# Patient Record
Sex: Female | Born: 1965 | Hispanic: Yes | Marital: Single | Smoking: Never smoker
Health system: Southern US, Community
[De-identification: ages and names within clinical notes are randomized; demographics above are authoritative.]

## PROBLEM LIST (undated history)

## (undated) DIAGNOSIS — I10 Essential (primary) hypertension: Secondary | ICD-10-CM

## (undated) DIAGNOSIS — R87619 Unspecified abnormal cytological findings in specimens from cervix uteri: Secondary | ICD-10-CM

## (undated) DIAGNOSIS — G5600 Carpal tunnel syndrome, unspecified upper limb: Secondary | ICD-10-CM

## (undated) DIAGNOSIS — IMO0002 Reserved for concepts with insufficient information to code with codable children: Secondary | ICD-10-CM

## (undated) DIAGNOSIS — E7800 Pure hypercholesterolemia, unspecified: Secondary | ICD-10-CM

## (undated) HISTORY — DX: Essential (primary) hypertension: I10

## (undated) HISTORY — DX: Pure hypercholesterolemia, unspecified: E78.00

## (undated) HISTORY — DX: Reserved for concepts with insufficient information to code with codable children: IMO0002

## (undated) HISTORY — DX: Carpal tunnel syndrome, unspecified upper limb: G56.00

## (undated) HISTORY — DX: Unspecified abnormal cytological findings in specimens from cervix uteri: R87.619

## (undated) HISTORY — PX: LEEP: SHX91

---

## 2006-10-10 ENCOUNTER — Ambulatory Visit: Payer: Self-pay | Admitting: Gynecology

## 2006-10-31 ENCOUNTER — Ambulatory Visit: Payer: Self-pay | Admitting: Obstetrics and Gynecology

## 2006-11-25 ENCOUNTER — Ambulatory Visit: Payer: Self-pay | Admitting: Obstetrics and Gynecology

## 2006-11-25 ENCOUNTER — Ambulatory Visit (HOSPITAL_COMMUNITY): Admission: RE | Admit: 2006-11-25 | Discharge: 2006-11-25 | Payer: Self-pay | Admitting: Obstetrics and Gynecology

## 2006-11-25 ENCOUNTER — Encounter: Payer: Self-pay | Admitting: Obstetrics and Gynecology

## 2006-12-19 ENCOUNTER — Ambulatory Visit: Payer: Self-pay | Admitting: Family Medicine

## 2007-05-28 ENCOUNTER — Encounter (INDEPENDENT_AMBULATORY_CARE_PROVIDER_SITE_OTHER): Payer: Self-pay | Admitting: Gynecology

## 2007-05-28 ENCOUNTER — Ambulatory Visit: Payer: Self-pay | Admitting: Gynecology

## 2008-01-16 ENCOUNTER — Encounter: Payer: Self-pay | Admitting: Obstetrics & Gynecology

## 2008-01-16 ENCOUNTER — Ambulatory Visit: Payer: Self-pay | Admitting: Obstetrics & Gynecology

## 2008-01-28 ENCOUNTER — Ambulatory Visit (HOSPITAL_COMMUNITY): Admission: RE | Admit: 2008-01-28 | Discharge: 2008-01-28 | Payer: Self-pay | Admitting: Obstetrics and Gynecology

## 2008-11-26 ENCOUNTER — Encounter: Payer: Self-pay | Admitting: Obstetrics & Gynecology

## 2008-11-26 ENCOUNTER — Ambulatory Visit: Payer: Self-pay | Admitting: Obstetrics & Gynecology

## 2009-03-16 ENCOUNTER — Ambulatory Visit (HOSPITAL_COMMUNITY): Admission: RE | Admit: 2009-03-16 | Discharge: 2009-03-16 | Payer: Self-pay | Admitting: Obstetrics & Gynecology

## 2010-01-20 ENCOUNTER — Emergency Department (HOSPITAL_COMMUNITY): Admission: EM | Admit: 2010-01-20 | Discharge: 2010-01-20 | Payer: Self-pay | Admitting: Emergency Medicine

## 2010-03-17 ENCOUNTER — Ambulatory Visit (HOSPITAL_COMMUNITY): Admission: RE | Admit: 2010-03-17 | Discharge: 2010-03-17 | Payer: Self-pay | Admitting: Obstetrics and Gynecology

## 2010-09-26 NOTE — Group Therapy Note (Signed)
NAME:  Brenda [name], REMPEL NO.:  [id number]   MEDICAL RECORD NO.:  [id number]          PATIENT TYPE:  WOC   LOCATION:  [facility]                   FACILITY:  [facility]   PHYSICIAN:  Elsie Lincoln, MD      DATE OF BIRTH:  [birth year]   DATE OF SERVICE:  01/16/2008                                  CLINIC NOTE   The patient is a 45 year old para 3 female who presents for repeat Pap  smear.  The patient underwent a cold knife cone for a positive ECC in  July 2008 that showed had high-grade squamous intraepithelial lesion  CIN2/CIN3, dysplasia cervically extends into underlying endocervical  glands with margins free of dysplasia.  The patient had a repeat Pap  smear once in January 2009, which was negative.  The patient missed  several appointments and rescheduled for today.   PAST MEDICAL HISTORY:  Denies all problems.   PAST SURGICAL HISTORY:  Denies all surgeries other than the cold knife  cone as described above.   OBSTETRICAL HISTORY:  NSVD x3.   GYNECOLOGIC HISTORY:  Using abstinence for contraception at this time.   FAMILY HISTORY:  No ovarian cancer, breast cancer, colon cancer, or  endometrial cancer.   MEDICATIONS:  Multivitamin.   ALLERGIES:  LATEX.  No medication __________   PHYSICAL EXAMINATION:  VITAL SIGNS:  Temperature 98.5, pulse 57, blood  pressure 122/87, weight 132.9, height 5 feet 0 inches.  GENERAL:  Well-nourished, well-developed in no apparent distress.  HEENT:  Normocephalic, atraumatic.  CHEST:  Normal movement.  ABDOMEN:  Soft, nontender.  GENITALIA:  Tanner V.  Vagina pink, normal rugae.  Cervix closed,  nontender.  Uterus anteverted, nontender.  Adnexa no masses, nontender.   A 45 year old female with CIN2/3 on cold knife cone for repeat Pap  smear.  1. Pap smear today.  2. Mammogram ordered through our scholarship program.  3. Return to clinic in 6 months if Pap smear is normal.  She  will      receive a letter in the mail.  If Pap  smear is abnormal, we will      come up with a different  plan.           ______________________________  Elsie Lincoln, MD     KL/MEDQ  D:  01/16/2008  T:  01/17/2008  Job:  [id number]

## 2010-09-26 NOTE — Group Therapy Note (Signed)
NAME:  Brenda [name], AHART NO.:  [id number]   MEDICAL RECORD NO.:  [id number]          PATIENT TYPE:  WOC   LOCATION:  [facility]                   [facility]ACILITY[facility]   PHYSICIAN:  Argentina Donovan, MD        DATE OF BIRTH:  05-Sep-1965   DATE OF SERVICE:  10/31/2006                                  CLINIC NOTE   The patient is a 45 year old [place] female gravida 3, para 3-0-0-3,  youngest child is age 47, who went to the health department had a  severe cervical dysplasia, CIN II, with cervix severe endocervical  involvement.  When she came in today and examined her she has a fish-  mouth cervix with a significantly large lesion which I think poses a  problem of too much bleeding here during the outpatient.  So I am going  to admit her and do a conization.  Cold knife conization biopsy  discussed.  The procedures well as possible complications of infection  and bleeding and the patient seems to be agreeable understands.  She has  no allergies.  She is on no medications except multivitamins. She has  used the pill in the past but her husband is not around at this point  and so she is using no contraception.   IMPRESSION:  Severe cervical dysplasia with a cervical involvement.   PLAN:  Cold knife conization of the cervix.           ______________________________  Argentina Donovan, MD     PR/MEDQ  D:  10/31/2006  T:  10/31/2006  Job:  [id number]

## 2010-09-26 NOTE — Group Therapy Note (Signed)
NAME:  Brenda [name], ZUNO NO.:  [id number]   MEDICAL RECORD NO.:  [id number]          PATIENT TYPE:  WOC   [facility]ATION[facility]                   [facility]   PHYSICIAN:  Scheryl Darter, MD       DATE OF BIRTH:  Oct 14, 1965   DATE OF SERVICE:  11/26/2008                                  CLINIC NOTE   The patient returns for repeat Pap smear.  She had a cold-knife  conization performed in July 2008 which showed moderate-to-severe  cervical dysplasia.  Pap test that was done on January 16, 2008, was  negative.  Her last menstrual period was November 06, 2008, and she  currently requires no contraception.   PHYSICAL EXAMINATION:  EXTERNAL GENITALIA:  Vagina and cervix were  normal except for some scarring secondary to her conization.  Pap was  done.   IMPRESSION:  History of moderate-to-severe cervical dysplasia.   PLAN:  Today's Pap smear is normal, then she can return for yearly Pap  smears.      Scheryl Darter, MD     JA/MEDQ  D:  11/26/2008  T:  11/27/2008  Job:  [id number]

## 2010-09-26 NOTE — Op Note (Signed)
[name], [name]            ACCOUNT NO.:  [id number]   MEDICAL RECORD NO.:  [id number]          PATIENT TYPE:  AMB   LOCATION:  [facility]                           [facility]   PHYSICIAN:  Phil D. Okey Dupre, M.D.     DATE OF BIRTH:  [birth year]   DATE OF PROCEDURE:  11/25/2006  DATE OF DISCHARGE:                               OPERATIVE REPORT   PROCEDURE:  Cold knife conization of the cervix.   PREOPERATIVE DIAGNOSIS:  Severe cervical dysplasia, CIN II with  endocervical involvement.   ANESTHESIA:  Was general   SPECIMENS TO PATHOLOGY:  Cervical conization biopsy.   ESTIMATED BLOOD LOSS:  50 mL.   POSTOPERATIVE CONDITION:  Satisfactory.   SURGEON:  Dr. Okey Dupre.   DESCRIPTION OF PROCEDURE:  Under satisfactory general anesthesia with  the patient in dorsolithotomy position, perineum, vagina prepped and  draped in usual sterile manner.  Bimanual pelvic examination under  anesthesia revealed the uterus in anterior presentation easily movable  with a free cul-de-sac and firm cervix.  A weighted speculum was placed  in the posterior fourchette of the vagina through a marital introitus.  The BUS was within normal limits.  Vagina is clean and well rugated. The  cervix was fishmouthed and somewhat hypertrophied. The cervix was  treated with Lugol's solution and almost the entire posterior lip was  non-stainable.  Angle sutures as high up as possible and below enough to  not hit the bladder  figure-of-eights using one chromic catgut were  placed at 9 and 3 o'clock figure-of-eights.  These were held long for  traction and the anterior lip of the cervix was grasped with single-  tooth tenaculum. Entire circumference of the cervix was run and starting  as high as we could just by the reflection the bladder with a  superficial #1 chromic catgut suture done as a pursestring around the  entire circumference of the cervix and approximately 3 cm from the  distal end and they were tied anteriorly  and then a conization was taken  trying to stay outside of the split with the fishmouth cervix and  extending up 2 cm into the cervical canal. The cone was taken with a #15  scalpel blade and sent for pathological diagnosis..  There was some  bleeding from the posterior cuff so the cuff was run in a baseball type  suture around the entire circumference and bleeding seemed to be well  controlled at that point. The stay sutures that were put in laterally  were cut short.  No significant bleeding was noted and the patient was  transferred recovery room in satisfactory condition.      Phil D. Okey Dupre, M.D.  Electronically Signed     PDR/MEDQ  D:  11/25/2006  T:  11/25/2006  Job:  [id number]

## 2011-02-27 LAB — CBC
HCT: 38.3
Hemoglobin: 13.1
MCHC: 34.3
MCV: 90.4
RBC: 4.23
RDW: 12.8

## 2011-02-27 LAB — URINALYSIS, ROUTINE W REFLEX MICROSCOPIC
Bilirubin Urine: NEGATIVE
Glucose, UA: NEGATIVE
Ketones, ur: NEGATIVE
Leukocytes, UA: NEGATIVE
Protein, ur: NEGATIVE
pH: 6

## 2011-02-28 LAB — POCT PREGNANCY, URINE
Operator id: 149021
Preg Test, Ur: NEGATIVE

## 2011-04-09 ENCOUNTER — Other Ambulatory Visit: Payer: Self-pay | Admitting: Obstetrics and Gynecology

## 2011-04-09 DIAGNOSIS — Z1231 Encounter for screening mammogram for malignant neoplasm of breast: Secondary | ICD-10-CM

## 2011-05-10 ENCOUNTER — Ambulatory Visit (HOSPITAL_COMMUNITY)
Admission: RE | Admit: 2011-05-10 | Discharge: 2011-05-10 | Disposition: A | Discharge status: Home / Self Care | Payer: Self-pay | Source: Ambulatory Visit | Attending: Obstetrics and Gynecology | Admitting: Obstetrics and Gynecology

## 2011-05-10 DIAGNOSIS — Z1231 Encounter for screening mammogram for malignant neoplasm of breast: Secondary | ICD-10-CM

## 2011-08-21 ENCOUNTER — Ambulatory Visit (INDEPENDENT_AMBULATORY_CARE_PROVIDER_SITE_OTHER): Payer: Self-pay | Admitting: *Deleted

## 2011-08-21 VITALS — BP 138/81 | HR 57 | Temp 97.6°F | Ht 60.0 in | Wt 126.4 lb

## 2011-08-21 DIAGNOSIS — Z01419 Encounter for gynecological examination (general) (routine) without abnormal findings: Secondary | ICD-10-CM

## 2011-08-21 NOTE — Patient Instructions (Signed)
Taught patient how to perform BSE and gave educational materials to take home. Let patient know will need a Pap smear in 1 year if today's Pap smear is normal. Patient did not need a mammogram today since last mammogram was 05/10/11. Let patient know [facility] will cover mammogram and gave her card with my phone number on it. Let her know to call me to schedule her next mammogram. Let patient know will follow up with her within the next couple weeks with results. Patient verbalized understanding.

## 2011-08-21 NOTE — Progress Notes (Signed)
No complaints today.  Pap Smear:    Pap smear completed today. Patients last Pap smear was 11/26/08 and normal. Patient has a history of a cervical conization 11/25/06 that showed severe dysplasia due to an abnormal HGSIL Pap smear. Per patient that is the only abnormal Pap smear she has had. Pap smear results above are in EPIC.  Physical exam: Breasts Breasts symmetrical. No skin abnormalities bilateral breasts. No nipple retraction bilateral breasts. No nipple discharge bilateral breasts. No lymphadenopathy. No lumps palpated bilateral breasts. No complaints of pain or tenderness. Patients last mammogram was 05/10/11 and normal. Patient will need next screening mammogram 05/09/12.          Pelvic/Bimanual   Ext Genitalia No lesions, no swelling and no discharge observed on external genitalia.         Vagina Vagina pink and normal texture. No lesions or discharge observed in vagina.          Cervix Cervix is present. Cervix pink with a beefy red area above cervical os. Cervix friable.  No discharge observed.     Uterus Uterus is present and palpable. Uterus in normal position and normal size.        Adnexae Bilateral ovaries present and palpable. No tenderness on palpation.          Rectovaginal No rectal exam completed today since patient had no rectal complaints. No skin abnormalities observed on exam.

## 2011-09-03 ENCOUNTER — Encounter: Payer: Self-pay | Admitting: Obstetrics and Gynecology

## 2011-09-19 ENCOUNTER — Encounter: Payer: Self-pay | Admitting: Obstetrics and Gynecology

## 2012-04-30 ENCOUNTER — Other Ambulatory Visit (HOSPITAL_COMMUNITY): Payer: Self-pay | Admitting: Physician Assistant

## 2012-04-30 DIAGNOSIS — Z1231 Encounter for screening mammogram for malignant neoplasm of breast: Secondary | ICD-10-CM

## 2012-05-21 ENCOUNTER — Ambulatory Visit (HOSPITAL_COMMUNITY)
Admission: RE | Admit: 2012-05-21 | Discharge: 2012-05-21 | Disposition: A | Discharge status: Home / Self Care | Payer: Self-pay | Source: Ambulatory Visit | Attending: Physician Assistant | Admitting: Physician Assistant

## 2012-05-21 DIAGNOSIS — Z1231 Encounter for screening mammogram for malignant neoplasm of breast: Secondary | ICD-10-CM

## 2012-08-28 ENCOUNTER — Ambulatory Visit: Payer: Self-pay | Admitting: Family Medicine

## 2012-08-28 ENCOUNTER — Ambulatory Visit: Payer: Self-pay

## 2012-08-28 VITALS — BP 130/80 | HR 67 | Temp 99.0°F | Resp 18 | Ht 60.0 in | Wt 128.0 lb

## 2012-08-28 DIAGNOSIS — L608 Other nail disorders: Secondary | ICD-10-CM

## 2012-08-28 DIAGNOSIS — L603 Nail dystrophy: Secondary | ICD-10-CM

## 2012-08-28 DIAGNOSIS — M79645 Pain in left finger(s): Secondary | ICD-10-CM

## 2012-08-28 DIAGNOSIS — M6780 Other specified disorders of synovium and tendon, unspecified site: Secondary | ICD-10-CM

## 2012-08-28 DIAGNOSIS — M6740 Ganglion, unspecified site: Secondary | ICD-10-CM

## 2012-08-28 DIAGNOSIS — M79609 Pain in unspecified limb: Secondary | ICD-10-CM

## 2012-08-28 MED ORDER — NAPROXEN 500 MG PO TABS: 500.0000 mg | ORAL_TABLET | Freq: Two times a day (BID) | ORAL | Status: DC

## 2012-08-28 NOTE — Patient Instructions (Addendum)
1. Ice thumb every night for 15-20 minutes. 2.  Take medication twice daily for two weeks. 3. Return if cyst enlarges quickly or becomes painful. 4.  Recommend taking multivitamin daily for peeling nails.

## 2012-08-28 NOTE — Progress Notes (Signed)
[address]   [place], [place]  [address]   [phone]  Subjective:    Patient ID: Brenda [name], female    DOB: 03-20-1966, 47 y.o.   MRN: [id number]  HPI This 47 y.o. female presents for evaluation of L thumb pain.  Onset of L thumb bump.  Not sure of etiology.  When bumps into things, mild pain.  No pain with range of motion.  No n/t; no swelling.  No triggering.  Bump seems larger.  No skin irritation; no joint swelling.  No trauma.  Currently on menses.  2.  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   Review of Systems  Constitutional: Negative for fever, chills, diaphoresis and fatigue.  Musculoskeletal: Negative for myalgias, joint swelling and arthralgias.  Skin: Negative for color change, pallor, rash and wound.  Neurological: Negative for weakness and numbness.    Past Medical History  Diagnosis Date  . Carpal tunnel syndrome   . Abnormal Pap smear     Past Surgical History  Procedure Laterality Date  . Leep      Prior to Admission medications   Medication Sig Start Date End Date Taking? Authorizing Provider  norethindrone-ethinyl estradiol (NECON,BREVICON,MODICON) 0.5-35 MG-MCG tablet Take 1 tablet by mouth daily.   Yes Historical Provider, MD  diclofenac (VOLTAREN) 50 MG EC tablet Take 50 mg by mouth 2 (two) times daily.    Historical Provider, MD  gabapentin (NEURONTIN) 300 MG capsule Take 300 mg by mouth 2 (two) times daily.    Historical Provider, MD    No Known Allergies  History   Social History  . Marital Status: Single    Spouse Name: N/A    Number of Children: N/A  . Years of Education: N/A   Occupational History  . Not on file.   Social History Main Topics  . Smoking status: Never Smoker   . Smokeless tobacco: Not on file  . Alcohol Use: No  . Drug Use: No  . Sexually Active: Yes    Birth Control/ Protection: None   Other Topics  Concern  . Not on file   Social History Narrative   Employment: housekeeping; from [place].    Family History  Problem Relation Age of Onset  . Hypertension Mother        Objective:   Physical Exam  Nursing note and vitals reviewed. Constitutional: She is oriented to person, place, and time. She appears well-developed and well-nourished. No distress.  Musculoskeletal:       Left hand: She exhibits normal range of motion, no tenderness, no bony tenderness, normal capillary refill, no deformity, no laceration and no swelling. Normal strength noted. She exhibits no finger abduction and no thumb/finger opposition.  L HAND:  PROXIMAL THUMB WITH PALPABLE CYSTIC LESION; NON-TENDER; FULL EXTENSION, FLEXION OF THUMB AT MCP AND IP JOINT.  MOTOR 5/5; NO LAXITY.  Neurological: She is alert and oriented to person, place, and time. She has normal strength. No sensory deficit.  Skin: Skin is warm and dry. No rash noted. She is not diaphoretic. No erythema. No pallor.  SLIGHT PEELING OF DISTAL NAILS B HANDS.  Psychiatric: She has a normal mood and affect. Her behavior is normal.     [facility] reading (PRIMARY) by  Dr. Katrinka Blazing.  L THUMB: NAD       Assessment & Plan:  Thumb pain, left - Plan: DG Finger Thumb  Left  Cyst of tendon sheath  Peeling of nails   1. Pain L thumb:  New.  Minimal pain; recommend Naproxen 500mg  bid for two weeks and then PRN. 2.  L thumb tendon sheath cyst:  New.  Recommend rest, ice, NSAIDs.  Reassurance provided.  No triggering at this time; no pain. RTC for rapid enlargement, development of pain. 3.  Peeling of nails: New. Due to excessive hand washing and chemical exposure with employment; recommend limiting hand washing, limit chemical exposure; also recommend MVI daily.  Meds ordered this encounter  Medications  . norethindrone-ethinyl estradiol (NECON,BREVICON,MODICON) 0.5-35 MG-MCG tablet    Sig: Take 1 tablet by mouth daily.  Marland Kitchen DISCONTD: naproxen (NAPROSYN) 500  MG tablet    Sig: Take 1 tablet (500 mg total) by mouth 2 (two) times daily with a meal.    Dispense:  30 tablet    Refill:  0  . naproxen (NAPROSYN) 500 MG tablet    Sig: Take 1 tablet (500 mg total) by mouth 2 (two) times daily with a meal.    Dispense:  30 tablet    Refill:  0

## 2013-06-23 ENCOUNTER — Other Ambulatory Visit (HOSPITAL_COMMUNITY): Payer: Self-pay | Admitting: Family

## 2013-06-23 DIAGNOSIS — Z1231 Encounter for screening mammogram for malignant neoplasm of breast: Secondary | ICD-10-CM

## 2013-07-01 ENCOUNTER — Ambulatory Visit (HOSPITAL_COMMUNITY)
Admission: RE | Admit: 2013-07-01 | Discharge: 2013-07-01 | Disposition: A | Discharge status: Home / Self Care | Payer: Self-pay | Source: Ambulatory Visit | Attending: Family | Admitting: Family

## 2013-07-01 DIAGNOSIS — Z1231 Encounter for screening mammogram for malignant neoplasm of breast: Secondary | ICD-10-CM

## 2014-05-26 ENCOUNTER — Other Ambulatory Visit (HOSPITAL_COMMUNITY): Payer: Self-pay | Admitting: Urology

## 2014-05-26 DIAGNOSIS — Z1231 Encounter for screening mammogram for malignant neoplasm of breast: Secondary | ICD-10-CM

## 2014-07-02 ENCOUNTER — Ambulatory Visit (HOSPITAL_COMMUNITY)
Admission: RE | Admit: 2014-07-02 | Discharge: 2014-07-02 | Disposition: A | Discharge status: Home / Self Care | Payer: Self-pay | Source: Ambulatory Visit | Attending: Urology | Admitting: Urology

## 2014-07-02 DIAGNOSIS — Z1231 Encounter for screening mammogram for malignant neoplasm of breast: Secondary | ICD-10-CM

## 2015-07-13 ENCOUNTER — Other Ambulatory Visit: Payer: Self-pay | Admitting: Infectious Disease

## 2015-07-13 DIAGNOSIS — Z1231 Encounter for screening mammogram for malignant neoplasm of breast: Secondary | ICD-10-CM

## 2015-07-15 ENCOUNTER — Ambulatory Visit
Admission: RE | Admit: 2015-07-15 | Discharge: 2015-07-15 | Disposition: A | Discharge status: Home / Self Care | Payer: No Typology Code available for payment source | Source: Ambulatory Visit | Attending: Infectious Disease | Admitting: Infectious Disease

## 2015-07-15 DIAGNOSIS — Z1231 Encounter for screening mammogram for malignant neoplasm of breast: Secondary | ICD-10-CM

## 2015-08-08 ENCOUNTER — Ambulatory Visit (INDEPENDENT_AMBULATORY_CARE_PROVIDER_SITE_OTHER): Payer: Self-pay

## 2015-08-08 ENCOUNTER — Encounter: Payer: Self-pay | Admitting: *Deleted

## 2015-08-08 ENCOUNTER — Ambulatory Visit (INDEPENDENT_AMBULATORY_CARE_PROVIDER_SITE_OTHER): Payer: Self-pay | Admitting: Physician Assistant

## 2015-08-08 VITALS — BP 144/74 | HR 76 | Temp 97.7°F | Resp 14 | Ht 60.5 in | Wt 129.2 lb

## 2015-08-08 DIAGNOSIS — L03032 Cellulitis of left toe: Secondary | ICD-10-CM

## 2015-08-08 DIAGNOSIS — M79675 Pain in left toe(s): Secondary | ICD-10-CM

## 2015-08-08 MED ORDER — SULFAMETHOXAZOLE-TRIMETHOPRIM 800-160 MG PO TABS: 1.0000 | ORAL_TABLET | Freq: Two times a day (BID) | ORAL | Status: DC

## 2015-08-08 NOTE — Patient Instructions (Addendum)
     IF you received an x-ray today, you will receive an invoice from [facility]. Please contact [facility] at [phone] with questions or concerns regarding your invoice.   IF you received labwork today, you will receive an invoice from [facility]. Please contact [facility] at [phone] with questions or concerns regarding your invoice.   Our billing staff will not be able to assist you with questions regarding bills from these companies.  You will be contacted with the lab results as soon as they are available. The fastest way to get your results is to activate your My Chart account. Instructions are located on the last page of this paperwork. If you have not heard from us regarding the results in 2 weeks, please contact this office.   Take ibuprofen for pain, 600mg  every 6 hours. I would like you to rest the foot, heat the toe 3 times per day for 15 minutes.   I would like you to return to clinic in 1 week if your symptoms have not improved.  You will return sooner if your symptoms worsen. Cellulitis Cellulitis is an infection of the skin and the tissue beneath it. The infected area is usually red and tender. Cellulitis occurs most often in the arms and lower legs.  CAUSES  Cellulitis is caused by bacteria that enter the skin through cracks or cuts in the skin. The most common types of bacteria that cause cellulitis are staphylococci and streptococci. SIGNS AND SYMPTOMS   Redness and warmth.  Swelling.  Tenderness or pain.  Fever. DIAGNOSIS  Your health care provider can usually determine what is wrong based on a physical exam. Blood tests may also be done. TREATMENT  Treatment usually involves taking an antibiotic medicine. HOME CARE INSTRUCTIONS   Take your antibiotic medicine as directed by your health care provider. Finish the antibiotic even if you start to feel better.  Keep the infected arm or leg elevated to reduce  swelling.  Apply a warm cloth to the affected area up to 4 times per day to relieve pain.  Take medicines only as directed by your health care provider.  Keep all follow-up visits as directed by your health care provider. SEEK MEDICAL CARE IF:   You notice red streaks coming from the infected area.  Your red area gets larger or turns dark in color.  Your bone or joint underneath the infected area becomes painful after the skin has healed.  Your infection returns in the same area or another area.  You notice a swollen bump in the infected area.  You develop new symptoms.  You have a fever. SEEK IMMEDIATE MEDICAL CARE IF:   You feel very sleepy.  You develop vomiting or diarrhea.  You have a general ill feeling (malaise) with muscle aches and pains.   This information is not intended to replace advice given to you by your health care provider. Make sure you discuss any questions you have with your health care provider.   Document Released: 02/07/2005 Document Revised: 01/19/2015 Document Reviewed: 07/16/2011 Elsevier Interactive Patient Education [employer]2016 Elsevier Inc.

## 2015-08-10 NOTE — Progress Notes (Signed)
[facility] [address], [place] [id number] [phone]  Date:  08/08/2015   Name:  Brenda [name]   DOB:  02/06/1966   MRN:  [id number]  PCP:  No primary care provider on file.   Chief Complaint  Patient presents with  . Toe Pain    left foot 3rd digit  . Flu Vaccine     History of Present Illness:  Brenda [name] is a 50 y.o. female patient who [facility] third middle toe pain.    Patient states that she has toe pain that started 2 days ago, and continues to worsen.  She states that she noticed this morning that is was almost unbearable to walk.  She has redness and tenderness of this toe.  She has had no trauma or hx of toe injury.  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  Patient states that she wears slippers at home at all times.  She does not recall any glass or anything breaking of known.    There are no active problems to display for this patient.   Past Medical History  Diagnosis Date  . Carpal tunnel syndrome   . Abnormal Pap smear     Past Surgical History  Procedure Laterality Date  . Leep      Social History  Substance Use Topics  . Smoking status: Never Smoker   . Smokeless tobacco: None  . Alcohol Use: No    Family History  Problem Relation Age of Onset  . Hypertension Mother     No Known Allergies  Medication list has been reviewed and updated.  Current Outpatient Prescriptions on File Prior to Visit  Medication Sig Dispense Refill  . diclofenac (VOLTAREN) 50 MG EC tablet Take 50 mg by mouth 2 (two) times daily. Reported on 08/08/2015    . gabapentin (NEURONTIN) 300 MG capsule Take 300 mg by mouth 2 (two) times daily. Reported on 08/08/2015    . naproxen (NAPROSYN) 500 MG tablet Take 1 tablet (500 mg total) by mouth 2 (two) times daily with a meal. (Patient not taking: Reported on 08/08/2015) 30 tablet 0  . norethindrone-ethinyl estradiol  (NECON,BREVICON,MODICON) 0.5-35 MG-MCG tablet Take 1 tablet by mouth daily. Reported on 08/08/2015     No current facility-administered medications on file prior to visit.    ROS ROS otherwise unremarkable unless listed above.  Physical Examination: BP 144/74 mmHg  Pulse 76  Temp(Src) 97.7 F (36.5 C) (Oral)  Resp 14  Ht 5' 0.5" (1.537 m)  Wt 129 lb 3.2 oz (58.605 kg)  BMI 24.81 kg/m2  SpO2 98% Ideal Body Weight: Weight in (lb) to have BMI = 25: 129.9  Physical Exam  Constitutional: She is oriented to person, place, and time. She appears well-developed and well-nourished. No distress.  HENT:  Head: Normocephalic and atraumatic.  Right Ear: External ear normal.  Left Ear: External ear normal.  Eyes: Conjunctivae and EOM are normal. Pupils are equal, round, and reactive to light.  Cardiovascular: Normal rate.   Pulmonary/Chest: Effort normal. No respiratory distress.  Neurological: She is alert and oriented to person, place, and time.  Skin: She is not diaphoretic.  3rd left toe with erythema that extends only to the proximal phalange.  There is tenderness upon palpation to the pip.  Tenderness with rom plantar flexion.  There is a tiny erythematous macule that is tender upon palpation at the  base of the plantar 3rd toe.  There is no break in the skin that is visible.  Psychiatric: She has a normal mood and affect. Her behavior is normal.     Assessment and Plan: SHAMERA YARBERRY is a 50 y.o. female who is here today for cc of third toe of left foot pain. This appears as possible cellulitis.  Will start abx and advised nsaids.  She will return if sxs do not improve or worsen.  There is concern for possible foreign body.    Cellulitis of toe of left foot - Plan: sulfamethoxazole-trimethoprim (BACTRIM DS,SEPTRA DS) 800-160 MG tablet  Toe pain, left - Plan: DG Toe 3rd Left, sulfamethoxazole-trimethoprim (BACTRIM DS,SEPTRA DS) 800-160 MG tablet   Brenda Platt,  PA-C [facility] 08/10/2015 8:21 PM

## 2016-08-21 ENCOUNTER — Other Ambulatory Visit: Payer: Self-pay | Admitting: Physician Assistant

## 2016-08-21 DIAGNOSIS — Z1231 Encounter for screening mammogram for malignant neoplasm of breast: Secondary | ICD-10-CM

## 2016-09-07 ENCOUNTER — Ambulatory Visit
Admission: RE | Admit: 2016-09-07 | Discharge: 2016-09-07 | Disposition: A | Discharge status: Home / Self Care | Payer: No Typology Code available for payment source | Source: Ambulatory Visit | Attending: Physician Assistant | Admitting: Physician Assistant

## 2016-09-07 DIAGNOSIS — Z1231 Encounter for screening mammogram for malignant neoplasm of breast: Secondary | ICD-10-CM

## 2017-04-01 ENCOUNTER — Encounter (HOSPITAL_COMMUNITY): Payer: Self-pay

## 2018-09-04 ENCOUNTER — Ambulatory Visit (INDEPENDENT_AMBULATORY_CARE_PROVIDER_SITE_OTHER): Payer: Self-pay

## 2018-09-04 ENCOUNTER — Ambulatory Visit (INDEPENDENT_AMBULATORY_CARE_PROVIDER_SITE_OTHER): Payer: Self-pay | Admitting: Orthopedic Surgery

## 2018-09-04 ENCOUNTER — Other Ambulatory Visit: Payer: Self-pay

## 2018-09-04 ENCOUNTER — Encounter (INDEPENDENT_AMBULATORY_CARE_PROVIDER_SITE_OTHER): Payer: Self-pay | Admitting: Orthopedic Surgery

## 2018-09-04 DIAGNOSIS — M79641 Pain in right hand: Secondary | ICD-10-CM

## 2018-09-04 DIAGNOSIS — M79642 Pain in left hand: Secondary | ICD-10-CM

## 2018-09-04 MED ORDER — PREDNISONE 5 MG (21) PO TBPK: ORAL_TABLET | ORAL | 0 refills | Status: DC

## 2018-09-04 NOTE — Progress Notes (Signed)
Office Visit Note   Patient: Brenda [name]           Date of Birth: 1965/10/22           MRN: [id number] Visit Date: 09/04/2018 Requested by: No referring provider defined for this encounter. PCP: [facility]  Subjective: Chief Complaint  Patient presents with  . Right Hand - Pain  . 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  She reports weakness and difficulty making a fist.  Denies any other joint complaints.  Has not had any trauma to either hand.  Denies any numbness and tingling in the hands.  Has been taking diclofenac which has not helped much.  Reports pain at the base of the left thumb first 2 months ago and then it moved into the right thumb at the base and then into the right third finger.              ROS: All systems reviewed are negative as they relate to the chief complaint within the history of present illness.  Patient denies  fevers or chills.   Assessment & Plan: Visit Diagnoses:  1. Bilateral hand pain     Plan: Impression is bilateral hand pain with definite swelling at the PIP joint of the third finger.  I think this could be early manifestation of some type of systemic autoimmune arthritis.  Plan is blood work today with Medrol Dosepak for some relief.  I will see her back after those labs come back in and she may need referral to rheumatologist at that time.  Follow-Up Instructions: Return in about 2 weeks (around 09/18/2018).   Orders:  Orders Placed This Encounter  Procedures  . XR Hand Complete Left  . XR Hand Complete Right  . Sed Rate (ESR)  . Antinuclear Antib (ANA)  . Rheumatoid Factor  . Uric acid  . Cyclic citrul peptide antibody, IgG  . C-reactive protein   Meds ordered this encounter  Medications  . predniSONE (STERAPRED UNI-PAK 21 TAB) 5 MG (21) TBPK tablet   Sig: Take dosepak as directed    Dispense:  21 tablet    Refill:  0      Procedures: No procedures performed   Clinical Data: No additional findings.  Objective: Vital Signs: There were no vitals taken for this visit.  Physical Exam:   Constitutional: Patient appears well-developed HEENT:  Head: Normocephalic Eyes:EOM are normal Neck: Normal range of motion Cardiovascular: Normal rate Pulmonary/chest: Effort normal Neurologic: Patient is alert Skin: Skin is warm Psychiatric: Patient has normal mood and affect    Ortho Exam: Ortho exam demonstrates good cervical spine range of motion.  Negative Tinel's cubital tunnel bilaterally.  Patient does have swelling and decreased range of motion PIP joint right hand.  Also has some pain at the [facility] the right thumb and left thumb.  Wrist range of motion is intact.  No significant synovitis or warmth in the MCP joints of either hand.  Radial pulses intact.  No definite paresthesias on the dorsal or palmar aspect of the hand.  ecialty Comments:  No specialty comments available.  Imaging: Xr Hand Complete Left  Result Date: 09/04/2018 AP lateral oblique left hand reviewed.  No significant arthritis or fracture or dislocation is present.  Normal left hand  Xr  Hand Complete Right  Result Date: 09/04/2018 AP lateral oblique right hand reviewed.  No significant arthritis is present.  No fracture or dislocation.  Normal right hand    PMFS History: There are no active problems to display for this patient.  Past Medical History:  Diagnosis Date  . Abnormal Pap smear   . Carpal tunnel syndrome     Family History  Problem Relation Age of Onset  . Hypertension Mother     Past Surgical History:  Procedure Laterality Date  . LEEP     Social History   Occupational History  . Not on file  Tobacco Use  . Smoking status: Never Smoker  Substance and Sexual Activity  . Alcohol use: No  . Drug use: No  . Sexual  activity: Yes    Birth control/protection: None

## 2018-09-05 NOTE — Progress Notes (Signed)
Please call patient.  All studies negative for any type of autoimmune arthritis.

## 2018-09-08 ENCOUNTER — Telehealth (INDEPENDENT_AMBULATORY_CARE_PROVIDER_SITE_OTHER): Payer: Self-pay | Admitting: Orthopedic Surgery

## 2018-09-08 LAB — C-REACTIVE PROTEIN: CRP: 0.6 mg/L (ref <8.0)

## 2018-09-08 LAB — ANA: Anti Nuclear Antibody (ANA): NEGATIVE

## 2018-09-08 LAB — URIC ACID: Uric Acid, Serum: 5.1 mg/dL (ref 2.5–7.0)

## 2018-09-08 LAB — RHEUMATOID FACTOR: Rheumatoid fact SerPl-aCnc: <14 IU/mL (ref <14)

## 2018-09-08 LAB — SEDIMENTATION RATE: Sed Rate: 9 mm/h (ref 0–20)

## 2018-09-08 LAB — CYCLIC CITRUL PEPTIDE ANTIBODY, IGG: Cyclic Citrullin Peptide Ab: 26 UNITS — ABNORMAL HIGH

## 2018-09-08 MED ORDER — PREDNISONE 5 MG (21) PO TBPK: ORAL_TABLET | ORAL | 0 refills | Status: DC

## 2018-09-08 NOTE — Telephone Encounter (Signed)
Pt called stating the pharmacy is saying they don't have a rx for her

## 2018-09-08 NOTE — Telephone Encounter (Signed)
Resubmitted to [facility] on [place] per patients request.

## 2018-09-09 NOTE — Progress Notes (Signed)
Please call patient with results. Thanks

## 2018-09-18 ENCOUNTER — Ambulatory Visit: Payer: Self-pay | Admitting: Orthopedic Surgery

## 2019-02-11 ENCOUNTER — Encounter: Payer: Self-pay | Admitting: Family Medicine

## 2019-02-11 ENCOUNTER — Ambulatory Visit: Payer: Self-pay | Attending: Family Medicine | Admitting: Family Medicine

## 2019-02-11 ENCOUNTER — Other Ambulatory Visit: Payer: Self-pay

## 2019-02-11 VITALS — BP 145/86 | HR 67 | Temp 98.3°F | Resp 16 | Ht 61.0 in | Wt 131.0 lb

## 2019-02-11 DIAGNOSIS — K649 Unspecified hemorrhoids: Secondary | ICD-10-CM

## 2019-02-11 DIAGNOSIS — I10 Essential (primary) hypertension: Secondary | ICD-10-CM

## 2019-02-11 DIAGNOSIS — Z23 Encounter for immunization: Secondary | ICD-10-CM

## 2019-02-11 DIAGNOSIS — E785 Hyperlipidemia, unspecified: Secondary | ICD-10-CM

## 2019-02-11 DIAGNOSIS — M79644 Pain in right finger(s): Secondary | ICD-10-CM

## 2019-02-11 DIAGNOSIS — M79645 Pain in left finger(s): Secondary | ICD-10-CM

## 2019-02-11 DIAGNOSIS — L918 Other hypertrophic disorders of the skin: Secondary | ICD-10-CM

## 2019-02-11 MED ORDER — SIMVASTATIN 10 MG PO TABS: 10.0000 mg | ORAL_TABLET | Freq: Every day | ORAL | 6 refills | Status: DC

## 2019-02-11 MED ORDER — LISINOPRIL 20 MG PO TABS: 20.0000 mg | ORAL_TABLET | Freq: Every day | ORAL | 1 refills | Status: DC

## 2019-02-11 NOTE — Progress Notes (Signed)
New patient  Medication RF Discuss sleep

## 2019-02-11 NOTE — Progress Notes (Signed)
Subjective:  Patient ID: Brenda [name], female    DOB: Sep 24, 1965  Age: 53 y.o. MRN: [id number]  CC: New Patient (Initial Visit)   HPI Brenda [name] presents to establish care and she has had issues since March of this year with noticing blood when wiping after a bowel movement as well as some constipation and hemorrhoids. She also has had long standing issues with inability to bend her thumbs. She also has hypertension for which she takes lisinopril. She also has a large skin tag on her buttock.         She reports that she has had issues with pain in her hands, mostly at the bases of her thumbs for more than a year.  Patient however has noticed that she is now having difficulty actually bending her thumbs and has weakness and movement of her thumbs.  Patient has difficulty holding onto objects as well as difficulty with items slipping out of her hands without her really being aware.  She did see orthopedics earlier in the year and was told that they did not really see any abnormalities other than some swelling in her right ring finger.  Patient states that due to lack of insurance she did not follow-up further with orthopedics and she states that she received a call stating that all of her blood work done at the visit was normal.  She continues to have a lot of pain at the base of her thumbs and difficulty using her hands.  She currently works in housekeeping.         Patient denies any pain with having a bowel movement.  She has seen no actual blood in the stool but more so just with wiping but she has had episode of passing a large amount of bright red blood immediately after having a hard bowel movement.  She denies any abdominal pain, no nausea or vomiting.  She has had issues with hard stools/constipation which occurs off and on.         She has also had issues with high blood pressure and high cholesterol but has been out of medication as she could not afford medical follow-up due to  her lack of insurance.  She previously took lisinopril for her blood pressure and she did not have any issues with the use of this medication.  She was also on a low dose of simvastatin for her cholesterol and she did not have any increased muscle or joint pain with the use of this medication.  Past Medical History:  Diagnosis Date  . Abnormal Pap smear   . Carpal tunnel syndrome     Past Surgical History:  Procedure Laterality Date  . LEEP      Family History  Problem Relation Age of Onset  . Hypertension Mother     Social History   Tobacco Use  . Smoking status: Never Smoker  . Smokeless tobacco: Never Used  Substance Use Topics  . Alcohol use: No    ROS Review of Systems  Constitutional: Negative for chills, fatigue and fever.  HENT: Negative for sore throat and trouble swallowing.   Eyes: Negative for photophobia and visual disturbance.  Respiratory: Negative for cough and shortness of breath.   Cardiovascular: Negative for chest pain, palpitations and leg swelling.  Gastrointestinal: Positive for anal bleeding and constipation. Negative for abdominal pain, diarrhea, nausea, rectal pain and vomiting.  Endocrine: Negative for cold intolerance, heat intolerance, polydipsia, polyphagia and polyuria.  Genitourinary: Negative for  dysuria and frequency.  Musculoskeletal: Positive for arthralgias. Negative for back pain.  Neurological: Negative for dizziness and headaches.  Hematological: Negative for adenopathy. Does not bruise/bleed easily.    Objective:   Today's Vitals: BP (!) 162/82 (BP Location: Left Arm, Patient Position: Sitting, Cuff Size: Normal)   Pulse 67   Temp 98.3 F (36.8 C) (Oral)   Resp 16   Ht 5\' 1"  (1.549 m)   Wt 131 lb (59.4 kg)   LMP 07/28/2016   SpO2 99%   BMI 24.75 kg/m   Physical Exam Vitals signs and nursing note reviewed.  Constitutional:      Appearance: Normal appearance.  Neck:     Musculoskeletal: Normal range of motion and neck  supple. No neck rigidity or muscular tenderness.     Vascular: No carotid bruit.  Cardiovascular:     Rate and Rhythm: Normal rate and regular rhythm.  Pulmonary:     Effort: Pulmonary effort is normal.     Breath sounds: Normal breath sounds.  Abdominal:     General: Bowel sounds are normal.     Tenderness: There is no abdominal tenderness. There is no right CVA tenderness, left CVA tenderness, guarding or rebound.  Genitourinary:    Comments: Patient with non-thrombosed and non-acute external hemorrhoids Musculoskeletal:     Right lower leg: No edema.     Left lower leg: No edema.     Comments: Patient with mild bony deformity and tenderness at the bases of the thumbs bilaterally and inability to flex her thumbs as well as decreased ability to grip/make her hands into fists  Lymphadenopathy:     Cervical: No cervical adenopathy.  Skin:    General: Skin is warm and dry.     Findings: Lesion present.     Comments: Patient with a large, grape sized, skin tag on the right medial buttock  Neurological:     General: No focal deficit present.     Mental Status: She is alert and oriented to person, place, and time.  Psychiatric:        Mood and Affect: Mood normal.        Behavior: Behavior normal.     Assessment & Plan:  - Need for immunization against influenza Patient was offered and agreed to have influenza immunization done at today's visit.  Immunization was provided as nurse visit - Flu Vaccine QUAD 36+ mos IM  1. Essential hypertension Blood pressure is elevated at today's visit at 162/82 initially and patient believes that this is due to anxiety about coming to the doctor.  Patient's repeat blood pressure still slightly elevated at 145/86 and patient's dose of lisinopril will be increased from 10 mg to 20 mg daily.  She should call or return if she has any issues otherwise she will have blood pressure recheck in office in the next 4 to 6 weeks and will likely have blood work  done at that time as well  2. Hyperlipidemia, unspecified hyperlipidemia type She reports a history of hyperlipidemia for which she is currently on simvastatin and she is provided with refill at today's visit.  She is nonfasting and will return in approximately 6 weeks and will discuss setting patient up to have fasting labs and CMP in follow-up of medication use  3. Hemorrhoids, unspecified hemorrhoid type Patient has some nonthrombosed small external, noninflamed external hemorrhoids and has had some blood when wiping after bowel movement.  She has been referred to gastroenterology for further evaluation and she  has been encouraged to apply for the financial assistance program through this office for help with the cost of her follow-up visits/specialty visits - Ambulatory referral to Gastroenterology  4. Bilateral thumb pain Patient with issues with bilateral thumb pain and difficulty bending the thumbs.  Patient had weakly positive CCP antibodies when she was seen by orthopedics.  She will be referred to rheumatology for further evaluation and may need referral to a hand specialist - Ambulatory referral to Rheumatology  5. Skin tag Patient with a large skin tag on the right buttock.  She is being referred to dermatology for removal of the skin tag but this could also be done by general surgery. - Ambulatory referral to Dermatology  Outpatient Encounter Medications as of 02/11/2019  Medication Sig  . lisinopril (ZESTRIL) 10 MG tablet Take 10 mg by mouth daily.  . simvastatin (ZOCOR) 10 MG tablet Take 10 mg by mouth daily.  . [DISCONTINUED] diclofenac (VOLTAREN) 50 MG EC tablet Take 50 mg by mouth 2 (two) times daily. Reported on 08/08/2015  . [DISCONTINUED] gabapentin (NEURONTIN) 300 MG capsule Take 300 mg by mouth 2 (two) times daily. Reported on 08/08/2015  . [DISCONTINUED] naproxen (NAPROSYN) 500 MG tablet Take 1 tablet (500 mg total) by mouth 2 (two) times daily with a meal. (Patient not  taking: Reported on 08/08/2015)  . [DISCONTINUED] norethindrone-ethinyl estradiol (NECON,BREVICON,MODICON) 0.5-35 MG-MCG tablet Take 1 tablet by mouth daily. Reported on 08/08/2015  . [DISCONTINUED] predniSONE (STERAPRED UNI-PAK 21 TAB) 5 MG (21) TBPK tablet Take dosepak as directed (Patient not taking: Reported on 02/11/2019)  . [DISCONTINUED] sulfamethoxazole-trimethoprim (BACTRIM DS,SEPTRA DS) 800-160 MG tablet Take 1 tablet by mouth 2 (two) times daily. (Patient not taking: Reported on 02/11/2019)   No facility-administered encounter medications on file as of 02/11/2019.    An After Visit Summary was printed and given to the patient.  Follow-up: Return in about 6 weeks (around 03/25/2019) for HTN.  Cain Saupeammie Nguyen Butler MD

## 2019-02-11 NOTE — Patient Instructions (Signed)
Hemorrhoids Hemorrhoids are swollen veins that may develop:  In the butt (rectum). These are called internal hemorrhoids.  Around the opening of the butt (anus). These are called external hemorrhoids. Hemorrhoids can cause pain, itching, or bleeding. Most of the time, they do not cause serious problems. They usually get better with diet changes, lifestyle changes, and other home treatments. What are the causes? This condition may be caused by:  Having trouble pooping (constipation).  Pushing hard (straining) to poop.  Watery poop (diarrhea).  Pregnancy.  Being very overweight (obese).  Sitting for long periods of time.  Heavy lifting or other activity that causes you to strain.  Anal sex.  Riding a bike for a long period of time. What are the signs or symptoms? Symptoms of this condition include:  Pain.  Itching or soreness in the butt.  Bleeding from the butt.  Leaking poop.  Swelling in the area.  One or more lumps around the opening of your butt. How is this diagnosed? A doctor can often diagnose this condition by looking at the affected area. The doctor may also:  Do an exam that involves feeling the area with a gloved hand (digital rectal exam).  Examine the area inside your butt using a small tube (anoscope).  Order blood tests. This may be done if you have lost a lot of blood.  Have you get a test that involves looking inside the colon using a flexible tube with a camera on the end (sigmoidoscopy or colonoscopy). How is this treated? This condition can usually be treated at home. Your doctor may tell you to change what you eat, make lifestyle changes, or try home treatments. If these do not help, procedures can be done to remove the hemorrhoids or make them smaller. These may involve:  Placing rubber bands at the base of the hemorrhoids to cut off their blood supply.  Injecting medicine into the hemorrhoids to shrink them.  Shining a type of light  energy onto the hemorrhoids to cause them to fall off.  Doing surgery to remove the hemorrhoids or cut off their blood supply. Follow these instructions at home: Eating and drinking   Eat foods that have a lot of fiber in them. These include whole grains, beans, nuts, fruits, and vegetables.  Ask your doctor about taking products that have added fiber (fibersupplements).  Reduce the amount of fat in your diet. You can do this by: ? Eating low-fat dairy products. ? Eating less red meat. ? Avoiding processed foods.  Drink enough fluid to keep your pee (urine) pale yellow. Managing pain and swelling   Take a warm-water bath (sitz bath) for 20 minutes to ease pain. Do this 3-4 times a day. You may do this in a bathtub or using a portable sitz bath that fits over the toilet.  If told, put ice on the painful area. It may be helpful to use ice between your warm baths. ? Put ice in a plastic bag. ? Place a towel between your skin and the bag. ? Leave the ice on for 20 minutes, 2-3 times a day. General instructions  Take over-the-counter and prescription medicines only as told by your doctor. ? Medicated creams and medicines may be used as told.  Exercise often. Ask your doctor how much and what kind of exercise is best for you.  Go to the bathroom when you have the urge to poop. Do not wait.  Avoid pushing too hard when you poop.  Keep your   butt dry and clean. Use wet toilet paper or moist towelettes after pooping.  Do not sit on the toilet for a long time.  Keep all follow-up visits as told by your doctor. This is important. Contact a doctor if you:  Have pain and swelling that do not get better with treatment or medicine.  Have trouble pooping.  Cannot poop.  Have pain or swelling outside the area of the hemorrhoids. Get help right away if you have:  Bleeding that will not stop. Summary  Hemorrhoids are swollen veins in the butt or around the opening of the butt.   They can cause pain, itching, or bleeding.  Eat foods that have a lot of fiber in them. These include whole grains, beans, nuts, fruits, and vegetables.  Take a warm-water bath (sitz bath) for 20 minutes to ease pain. Do this 3-4 times a day. This information is not intended to replace advice given to you by your health care provider. Make sure you discuss any questions you have with your health care provider. Document Released: 02/07/2008 Document Revised: 05/08/2018 Document Reviewed: 09/19/2017 Elsevier Patient Education  2020 [employer]  

## 2019-03-04 ENCOUNTER — Other Ambulatory Visit: Payer: Self-pay

## 2019-03-04 ENCOUNTER — Ambulatory Visit: Payer: Self-pay | Attending: Family Medicine

## 2019-03-26 ENCOUNTER — Ambulatory Visit: Payer: Self-pay | Admitting: Family Medicine

## 2019-04-23 ENCOUNTER — Encounter: Payer: Self-pay | Admitting: Family Medicine

## 2019-04-23 ENCOUNTER — Other Ambulatory Visit: Payer: Self-pay

## 2019-04-23 ENCOUNTER — Ambulatory Visit: Payer: Self-pay | Attending: Family Medicine | Admitting: Family Medicine

## 2019-04-23 VITALS — BP 125/75 | HR 61 | Temp 98.2°F | Resp 18 | Ht 60.0 in | Wt 131.0 lb

## 2019-04-23 DIAGNOSIS — Z79899 Other long term (current) drug therapy: Secondary | ICD-10-CM

## 2019-04-23 DIAGNOSIS — R05 Cough: Secondary | ICD-10-CM

## 2019-04-23 DIAGNOSIS — I10 Essential (primary) hypertension: Secondary | ICD-10-CM

## 2019-04-23 DIAGNOSIS — R058 Other specified cough: Secondary | ICD-10-CM

## 2019-04-23 DIAGNOSIS — Z1211 Encounter for screening for malignant neoplasm of colon: Secondary | ICD-10-CM

## 2019-04-23 DIAGNOSIS — E785 Hyperlipidemia, unspecified: Secondary | ICD-10-CM

## 2019-04-23 MED ORDER — AMLODIPINE BESYLATE 5 MG PO TABS: 5.0000 mg | ORAL_TABLET | Freq: Every day | ORAL | 5 refills | Status: DC

## 2019-04-23 NOTE — Patient Instructions (Signed)
Cancer Screening for Women A cancer screening is a test or exam that checks for cancer. Your health care provider will recommend specific cancer screenings based on your age, personal history, and family history of cancer. Work with your health care provider to create a cancer screening schedule that protects your health. Why is cancer screening done? Cancer screening is done to look for cancer in the very early stages, before it spreads and becomes harder to treat and before you would start to notice symptoms. Finding cancer early improves the chances of successful treatment. It may save your life. Who should be screened for cancer? All women should be screened for certain cancers, including breast cancer, cervical cancer, and skin cancer. Your health care provider may recommend screenings for other types of cancer if:  You had cancer before.  You have a family member with cancer.  You have abnormal genes that could increase the risk of cancer.  You have risk factors for certain cancers, such as smoking. When you should be screened for cancer depends on:  Your age.  Your medical history and your family's medical history.  Certain lifestyle factors, such as smoking.  Environmental exposure, such as to asbestos. What are some common cancer screenings? Breast cancer Breast cancer screening is done with a test that takes images of breast tissue (mammogram). Here are some screening guidelines:  When you are age 40-44, you will be given the choice to start having mammograms.  When you are age 45-54, you should have a mammogram every year.  You may start having mammograms before age 45 if you have risk factors for breast cancer, such as having an immediate family member with breast cancer.  When you are age 55 or older, you should have a mammogram every 1-2 years for as long as you are in good health and have a life expectancy of 10 years or more.  It is important to know what your  breasts look and feel like so you can report any changes to your health care provider.  Cervical cancer Cervical cancer screening is done with a Pap test. This testchecks for abnormalities, including the virus that causes cervical cancer (human papillomavirus, or HPV). To perform the test, a health care provider takes a swab of cervical cells during a pelvic exam. Screening for cervical cancer with a Pap test should start at age 21. Here are some screening guidelines:  When you are age 21-29, you should have a Pap test every 3 years.  When you are age 30-65, you should have a Pap test and HPV test every 5 years or have a Pap test every 3 years.  You may be screened for cervical cancer more often if you have risk factors for cervical cancer.  If your Pap tests are abnormal, you may have an HPV test.  If you have had the HPV vaccine, you will still be screened for cervical cancer and follow normal screening recommendations. You do not need to be screened for cervical cancer if any of the following apply to you:  You are older than age 65 and you have not had a serious cervical precancer or cancer in the last 20 years.  Your cervix and uterus have been removed and you have never had cervical cancer or precancerous cells. Endometrial cancer There is no standard screening test for endometrial cancer, but the cancer can be detected with:  A test of a sample of tissue taken from the lining of the uterus (endometrial tissue   biopsy).  A vaginal ultrasound.  Pap tests. If you are at increased risk for endometrial cancer, you may need to have these tests more often than normal. You are at increased risk if:  You have a family history of ovarian, uterine, or colon cancer.  You are taking tamoxifen, a drug that is used to treat breast cancer.  You have certain types of colon cancer. If you have reached menopause, it is especially important to talk with your health care provider about any vaginal  bleeding or spotting. Screening for endometrial cancer is not recommended for women who do not have symptoms of the cancer, such as vaginal bleeding. Colorectal cancer  All adults should have screening for colorectal cancer starting at age 50 and continuing until age 75. Your health care provider may recommend screening at age 45. You will have tests every 1-10 years, depending on your results and the type of screening test. If you have a family history of colon or rectal cancer or other risk factors, you may need to start having screenings earlier. Talk with your health care provider about which screening test is right for you and how often you should be screened. Colorectal cancer screening looks for cancer or for growths called polyps that often form before cancer starts. Tests to look for cancer or polyps include:  Colonoscopy or flexible sigmoidoscopy. For these procedures, a flexible tube with a small camera is inserted into the rectum.  CT colonography. This test uses X-rays and a contrast dye to check the colon for polyps. If a polyp is found, you may need to have a colonoscopy so the polyp can be located and removed. Tests to look for cancer in the stool (feces) include:  Guaiac-based fecal occult blood test (FOBT). This test detects blood in stool. It can be done at home with a kit.  Fecal immunochemical test (FIT). This test detects blood in stool. For this test, you will need to collect stool samples at home.  Stool DNA test. This test looks for blood in stool and any changes in DNA that can lead to colon cancer. For this test, you will need to collect a stool sample at home and send it to a lab.  Skin cancer Skin cancer screening is done by checking the skin for unusual moles or spots and any changes in existing moles. Your health care provider should check your skin for signs of skin cancer at every physical exam. You should check your skin every month and tell your health care  provider right away if anything looks unusual. Women with a higher-than-normal risk for skin cancer may want to see a skin specialist (dermatologist) for an annual body check. Lung cancer Lung cancer screening is done with a CT scan that looks for abnormal cells in the lungs. Discuss lung cancer screening with your health care provider if you are 55-74 years old and if any of the following apply to you:  You currently smoke.  You used to smoke heavily.  You have a smoking history of 1 pack a day for 30 years or 2 packs a day for 15 years.  You have quit smoking within the past 15 years. If you smoke heavily or if you used to smoke, you may need to be screened every year. Where to find more information  National Cancer Institute: https://www.cancer.gov/about-cancer/screening  Centers for Disease Control and Prevention: https://www.cdc.gov/cancer/dcpc/prevention/screening.htm  Department of Health and Human Services: https://www.womenshealth.gov/screening-tests-and-vaccines/screening-tests-for-women Contact a health care provider if:  You have   concerns about any signs or symptoms of cancer, such as: ? Moles that have an unusual shape or color. ? Changes in existing moles. ? A sore on your skin that does not heal. ? Blood in your stool. ? Fatigue that does not go away. ? Frequent pain or cramping in your abdomen. ? Coughing, or coughing up blood. ? Losing weight without trying. ? Lumps or other changes in your breasts. ? Vaginal bleeding, spotting, or changes in your periods. Summary  Be aware of and watch for signs and symptoms of cancer, especially symptoms of breast cancer, cervical cancer, endometrial cancer, colorectal cancer, skin cancer, and lung cancer.  Early detection of cancer with cancer screening may save your life.  Talk with your health care provider about your specific cancer risks.  Work together with your health care provider to create a cancer screening plan  that is right for you. This information is not intended to replace advice given to you by your health care provider. Make sure you discuss any questions you have with your health care provider. Document Released: 01/26/2016 Document Revised: 08/20/2018 Document Reviewed: 01/26/2016 Elsevier Patient Education  2020 [employer].  Hypertension, Adult Hypertension is another name for high blood pressure. High blood pressure forces your heart to work harder to pump blood. This can cause problems over time. There are two numbers in a blood pressure reading. There is a top number (systolic) over a bottom number (diastolic). It is best to have a blood pressure that is below 120/80. Healthy choices can help lower your blood pressure, or you may need medicine to help lower it. What are the causes? The cause of this condition is not known. Some conditions may be related to high blood pressure. What increases the risk?  Smoking.  Having type 2 diabetes mellitus, high cholesterol, or both.  Not getting enough exercise or physical activity.  Being overweight.  Having too much fat, sugar, calories, or salt (sodium) in your diet.  Drinking too much alcohol.  Having long-term (chronic) kidney disease.  Having a family history of high blood pressure.  Age. Risk increases with age.  Race. You may be at higher risk if you are African American.  Gender. Men are at higher risk than women before age 58. After age 12, women are at higher risk than men.  Having obstructive sleep apnea.  Stress. What are the signs or symptoms?  High blood pressure may not cause symptoms. Very high blood pressure (hypertensive crisis) may cause: ? Headache. ? Feelings of worry or nervousness (anxiety). ? Shortness of breath. ? Nosebleed. ? A feeling of being sick to your stomach (nausea). ? Throwing up (vomiting). ? Changes in how you see. ? Very bad chest pain. ? Seizures. How is this treated?  This  condition is treated by making healthy lifestyle changes, such as: ? Eating healthy foods. ? Exercising more. ? Drinking less alcohol.  Your health care provider may prescribe medicine if lifestyle changes are not enough to get your blood pressure under control, and if: ? Your top number is above 130. ? Your bottom number is above 80.  Your personal target blood pressure may vary. Follow these instructions at home: Eating and drinking   If told, follow the DASH eating plan. To follow this plan: ? Fill one half of your plate at each meal with fruits and vegetables. ? Fill one fourth of your plate at each meal with whole grains. Whole grains include whole-wheat pasta, brown rice, and  whole-grain bread. ? Eat or drink low-fat dairy products, such as skim milk or low-fat yogurt. ? Fill one fourth of your plate at each meal with low-fat (lean) proteins. Low-fat proteins include fish, chicken without skin, eggs, beans, and tofu. ? Avoid fatty meat, cured and processed meat, or chicken with skin. ? Avoid pre-made or processed food.  Eat less than 1,500 mg of salt each day.  Do not drink alcohol if: ? Your doctor tells you not to drink. ? You are pregnant, may be pregnant, or are planning to become pregnant.  If you drink alcohol: ? Limit how much you use to:  0-1 drink a day for women.  0-2 drinks a day for men. ? Be aware of how much alcohol is in your drink. In the [place] one drink equals one 12 oz bottle of beer (355 mL), one 5 oz glass of wine (148 mL), or one 1 oz glass of hard liquor (44 mL). Lifestyle   Work with your doctor to stay at a healthy weight or to lose weight. Ask your doctor what the best weight is for you.  Get at least 30 minutes of exercise most days of the week. This may include walking, swimming, or biking.  Get at least 30 minutes of exercise that strengthens your muscles (resistance exercise) at least 3 days a week. This may include lifting weights or  doing Pilates.  Do not use any products that contain nicotine or tobacco, such as cigarettes, e-cigarettes, and chewing tobacco. If you need help quitting, ask your doctor.  Check your blood pressure at home as told by your doctor.  Keep all follow-up visits as told by your doctor. This is important. Medicines  Take over-the-counter and prescription medicines only as told by your doctor. Follow directions carefully.  Do not skip doses of blood pressure medicine. The medicine does not work as well if you skip doses. Skipping doses also puts you at risk for problems.  Ask your doctor about side effects or reactions to medicines that you should watch for. Contact a doctor if you:  Think you are having a reaction to the medicine you are taking.  Have headaches that keep coming back (recurring).  Feel dizzy.  Have swelling in your ankles.  Have trouble with your vision. Get help right away if you:  Get a very bad headache.  Start to feel mixed up (confused).  Feel weak or numb.  Feel faint.  Have very bad pain in your: ? Chest. ? Belly (abdomen).  Throw up more than once.  Have trouble breathing. Summary  Hypertension is another name for high blood pressure.  High blood pressure forces your heart to work harder to pump blood.  For most people, a normal blood pressure is less than 120/80.  Making healthy choices can help lower blood pressure. If your blood pressure does not get lower with healthy choices, you may need to take medicine. This information is not intended to replace advice given to you by your health care provider. Make sure you discuss any questions you have with your health care provider. Document Released: 10/17/2007 Document Revised: 01/08/2018 Document Reviewed: 01/08/2018 Elsevier Patient Education  2020 [employer].

## 2019-04-23 NOTE — Progress Notes (Signed)
Established Patient Office Visit  Subjective:  Patient ID: Brenda [name], female    DOB: Apr 11, 1966  Age: 53 y.o. MRN: [id number]  CC: No chief complaint on file.   HPI Brenda [name] , 53 yo female, who is status post visit on 02/11/2019 to establish care.  She has a history of carpal tunnel syndrome which is followed by Dr. Marlou Sa in orthopedics.  Patient with history of hypertension but at last visit had been out of her medication and patient was given prescription for lisinopril 20 mg daily as she had taken lisinopril 10 mg in the past without difficulty but blood pressure at her visit was 162/82.  Refill provided of simvastatin for history of hyperlipidemia and appointment for fasting labs.  Referral to GI due to complaint of rectal bleeding but patient also with hemorrhoids on exam.  Patient was also encouraged to follow-up with orthopedics regarding bilateral thumb pain and she was also referred to rheumatology.  Referral was made to orthopedics for skin tag on the right buttock.  She is being seen today in follow-up.       She reports that she feels well at today's visit however she has had scratchy throat and nonproductive cough since restarting use of lisinopril.  No headaches or dizziness related to her blood pressure.  Blood pressure today's visit is very well controlled.  She reports no abdominal pain, no nausea/vomiting/diarrhea or constipation.  No blood in the stool and no further issues with hemorrhoids at this time.  She has not yet heard about her referral appointments for GI and surgery regarding the skin tag on her buttocks.  She is taking simvastatin without muscle aches.  She has not eaten prior to today's visit, only had water and can have fasting blood work done today.  Overall she feels very well.          Past Medical History:  Diagnosis Date  . Abnormal Pap smear   . Carpal tunnel syndrome     Past Surgical History:  Procedure Laterality Date  . LEEP       Family History  Problem Relation Age of Onset  . Hypertension Mother   . Hypertension Father     Social History   Socioeconomic History  . Marital status: Single    Spouse name: Not on file  . Number of children: 3  . Years of education: Not on file  . Highest education level: Not on file  Occupational History  . Not on file  Tobacco Use  . Smoking status: Never Smoker  . Smokeless tobacco: Never Used  Substance and Sexual Activity  . Alcohol use: No  . Drug use: No  . Sexual activity: Not Currently    Birth control/protection: None  Other Topics Concern  . Not on file  Social History Narrative   Employment: housekeeping; from [place].   Social Determinants of Health   Financial Resource Strain:   . Difficulty of Paying Living Expenses: Not on file  Food Insecurity:   . Worried About Charity fundraiser in the Last Year: Not on file  . Ran Out of Food in the Last Year: Not on file  Transportation Needs:   . Lack of Transportation (Medical): Not on file  . Lack of Transportation (Non-Medical): Not on file  Physical Activity: Insufficiently Active  . Days of Exercise per Week: 2 days  . Minutes of Exercise per Session: 20 min  Stress:   . Feeling of Stress : Not on  file  Social Connections:   . Frequency of Communication with Friends and Family: Not on file  . Frequency of Social Gatherings with Friends and Family: Not on file  . Attends Religious Services: Not on file  . Active Member of Clubs or Organizations: Not on file  . Attends Archivist Meetings: Not on file  . Marital Status: Not on file  Intimate Partner Violence:   . Fear of Current or Ex-Partner: Not on file  . Emotionally Abused: Not on file  . Physically Abused: Not on file  . Sexually Abused: Not on file    Outpatient Medications Prior to Visit  Medication Sig Dispense Refill  . lisinopril (ZESTRIL) 20 MG tablet Take 1 tablet (20 mg total) by mouth daily. 90 tablet 1  .  simvastatin (ZOCOR) 10 MG tablet Take 1 tablet (10 mg total) by mouth daily. 30 tablet 6   No facility-administered medications prior to visit.    No Known Allergies  ROS Review of Systems  Constitutional: Negative for chills, fatigue and fever.  HENT: Negative for sore throat and trouble swallowing.   Eyes: Negative for photophobia and visual disturbance.  Respiratory: Negative for cough and shortness of breath.   Cardiovascular: Negative for chest pain and palpitations.  Gastrointestinal: Negative for abdominal pain, blood in stool, constipation and diarrhea.  Endocrine: Negative for cold intolerance, heat intolerance, polydipsia, polyphagia and polyuria.  Genitourinary: Negative for dysuria and frequency.  Musculoskeletal: Negative for arthralgias and back pain.  Neurological: Negative for dizziness and headaches.  Hematological: Negative for adenopathy. Does not bruise/bleed easily.      Objective:    Physical Exam  Constitutional: She is oriented to person, place, and time. She appears well-developed and well-nourished.  Well-nourished well-developed female in no acute distress wearing mask as per office COVID-19 protocol  Neck: No JVD present.  Cardiovascular: Normal rate and regular rhythm.  Pulmonary/Chest: Effort normal and breath sounds normal.  Abdominal: Soft. There is no abdominal tenderness. There is no rebound and no guarding.  Musculoskeletal:        General: No tenderness or edema.     Cervical back: Normal range of motion and neck supple.  Lymphadenopathy:    She has no cervical adenopathy.  Neurological: She is alert and oriented to person, place, and time.  Psychiatric: She has a normal mood and affect. Her behavior is normal.  Nursing note and vitals reviewed.  BP 125/75 (BP Location: Left Arm, Patient Position: Sitting, Cuff Size: Normal)   Pulse 61   Temp 98.2 F (36.8 C) (Oral)   Resp 18   Ht 5' (1.524 m)   Wt 131 lb (59.4 kg)   LMP 07/28/2016    SpO2 99%   BMI 25.58 kg/m  LMP 07/28/2016  Wt Readings from Last 3 Encounters:  02/11/19 131 lb (59.4 kg)  08/08/15 129 lb 3.2 oz (58.6 kg)  08/28/12 128 lb (58.1 kg)     Health Maintenance Due  Topic Date Due  . HIV Screening  10/01/1982  . TETANUS/TDAP  10/01/1986  . PAP SMEAR-Modifier  08/21/2014  . COLONOSCOPY  09/30/2017  . MAMMOGRAM  09/08/2018     No results found for: TSH Lab Results  Component Value Date   WBC 8.5 11/25/2006   HGB 13.1 11/25/2006   HCT 38.3 11/25/2006   MCV 90.4 11/25/2006   PLT 341 11/25/2006   No results found for: NA, K, CHLORIDE, CO2, GLUCOSE, BUN, CREATININE, BILITOT, ALKPHOS, AST, ALT, PROT, ALBUMIN, CALCIUM,  ANIONGAP, EGFR, GFR No results found for: CHOL No results found for: HDL No results found for: LDLCALC No results found for: TRIG No results found for: CHOLHDL No results found for: HGBA1C    Assessment & Plan:  1. Essential hypertension Patient's blood pressure is well controlled at today's visit however she reports a nonproductive cough and scratchy throat since restarting lisinopril.  We will stop lisinopril and have patient take amlodipine 5 mg daily.  Electrolytes will be checked as part of comprehensive metabolic panel.  Patient will have follow-up with clinical pharmacist for blood pressure recheck and to make sure that cough is resolved.  She will also have repeat lipid panel at today's visit. - Lipid panel  2. Hyperlipidemia, unspecified hyperlipidemia type; 3.  Encounter for long-term current use of medication Patient is currently on simvastatin.  She will have lipid panel and comprehensive metabolic panel in follow-up of medication use at today's visit. - Comprehensive metabolic panel - Lipid panel  4. Recurrent nonproductive cough Will stop lisinopril.  Patient will return for blood pressure recheck and should also let nurse or clinical pharmacist know if cough has resolved since medication has been changed.  5.   Screening for colon cancer Referral again placed to GI in follow-up of hemorrhoids and for screening for colon cancer   An After Visit Summary was printed and given to the patient.  Follow-up: Return in about 4 months (around 08/22/2019) for Chronic issues; 3 to 4 weeks with Luke/CPP for blood pressure recheck.   Antony Blackbird, MD

## 2019-04-24 LAB — COMPREHENSIVE METABOLIC PANEL WITH GFR
ALT: 8 IU/L (ref 0–32)
AST: 13 IU/L (ref 0–40)
Albumin/Globulin Ratio: 1.9 (ref 1.2–2.2)
Albumin: 4.3 g/dL (ref 3.8–4.9)
Alkaline Phosphatase: 79 IU/L (ref 39–117)
BUN/Creatinine Ratio: 18 (ref 9–23)
BUN: 13 mg/dL (ref 6–24)
Bilirubin Total: <0.2 mg/dL (ref 0.0–1.2)
CO2: 21 mmol/L (ref 20–29)
Calcium: 9.8 mg/dL (ref 8.7–10.2)
Chloride: 106 mmol/L (ref 96–106)
Creatinine, Ser: 0.73 mg/dL (ref 0.57–1.00)
GFR calc Af Amer: 110 mL/min/1.73
GFR calc non Af Amer: 96 mL/min/1.73
Globulin, Total: 2.3 g/dL (ref 1.5–4.5)
Glucose: 91 mg/dL (ref 65–99)
Potassium: 4.6 mmol/L (ref 3.5–5.2)
Sodium: 142 mmol/L (ref 134–144)
Total Protein: 6.6 g/dL (ref 6.0–8.5)

## 2019-04-24 LAB — LIPID PANEL
Chol/HDL Ratio: 2.6 ratio (ref 0.0–4.4)
Cholesterol, Total: 242 mg/dL — ABNORMAL HIGH (ref 100–199)
HDL: 94 mg/dL
LDL Chol Calc (NIH): 138 mg/dL — ABNORMAL HIGH (ref 0–99)
Triglycerides: 62 mg/dL (ref 0–149)
VLDL Cholesterol Cal: 10 mg/dL (ref 5–40)

## 2019-04-29 ENCOUNTER — Encounter: Payer: Self-pay | Admitting: Internal Medicine

## 2019-04-30 ENCOUNTER — Encounter: Payer: Self-pay | Admitting: *Deleted

## 2019-05-01 ENCOUNTER — Telehealth: Payer: Self-pay | Admitting: Family Medicine

## 2019-05-01 NOTE — Telephone Encounter (Signed)
Spoke with patient and informed her with her lab results and patient verbalized understanding and agreed with advise.

## 2019-05-01 NOTE — Telephone Encounter (Signed)
Patient called back for labs No RMA or CMA was available

## 2019-05-18 ENCOUNTER — Ambulatory Visit: Payer: Self-pay | Admitting: Pharmacist

## 2019-05-19 ENCOUNTER — Encounter: Payer: Self-pay | Admitting: Family Medicine

## 2019-05-21 ENCOUNTER — Ambulatory Visit: Payer: Self-pay | Attending: Family Medicine | Admitting: Pharmacist

## 2019-05-21 ENCOUNTER — Other Ambulatory Visit: Payer: Self-pay

## 2019-05-21 VITALS — BP 139/86 | HR 72

## 2019-05-21 DIAGNOSIS — I10 Essential (primary) hypertension: Secondary | ICD-10-CM

## 2019-05-21 NOTE — Progress Notes (Signed)
   S:    Patient arrives in good spirits.    Presents to the clinic for hypertension evaluation, counseling, and management.  Patient was referred and last seen by Primary Care Provider on 04/23/19.  Lisinopril switched to amlodipine at that visit d/t cough; BP was well-controlled on lisinopril.    Patient reports adherence with medications. Reports that her dry cough has resolved.   Denies chest pain, dyspnea, headache, or blurred vision. Denies BLE edema.   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 Family / Social history:  - FHx: HTN - Tobacco: never smoker - Alcohol: denies use  O:  Vitals:   05/21/19 0926  BP: 139/86  Pulse: 72   Home BP readings: has not checked since starting amlodipine  Last 3 Office BP readings: BP Readings from Last 3 Encounters:  04/23/19 125/75  02/11/19 (!) 145/86  08/08/15 (!) 144/74   BMET    Component Value Date/Time   NA 142 04/23/2019 0934   K 4.6 04/23/2019 0934   CL 106 04/23/2019 0934   CO2 21 04/23/2019 0934   GLUCOSE 91 04/23/2019 0934   BUN 13 04/23/2019 0934   CREATININE 0.73 04/23/2019 0934   CALCIUM 9.8 04/23/2019 0934   GFRNONAA 96 04/23/2019 0934   GFRAA 110 04/23/2019 0934    Renal function: CrCl cannot be calculated (Patient's most recent lab result is older than the maximum 21 days allowed.).  Clinical ASCVD: No  The 10-year ASCVD risk score Denman George DC Jr., et al., 2013) is: 1.2%   Values used to calculate the score:     Age: 54 years     Sex: Female     Is Non-Hispanic African American: No     Diabetic: No     Tobacco smoker: No     Systolic Blood Pressure: 125 mmHg     Is BP treated: Yes     HDL Cholesterol: 94 mg/dL     Total Cholesterol: 242  mg/dL   A/P: Hypertension longstanding currently above goal on current medications. BP Goal = <130/80 mmHg. Patient confirms adherence to medications. Pt is amenable to increase amlodipine to 10 mg daily.  -Increased dose of amlodipine to 10 mg.  -Counseled on lifestyle modifications for blood pressure control including reduced dietary sodium, increased exercise, adequate sleep  Results reviewed and written information provided.   Total time in face-to-face counseling 15 minutes.   F/U Clinic Visit in 1 month.  Butch Penny, PharmD, CPP Clinical Pharmacist [facility] [phone]

## 2019-05-22 ENCOUNTER — Encounter: Payer: Self-pay | Admitting: Pharmacist

## 2019-05-22 MED ORDER — AMLODIPINE BESYLATE 10 MG PO TABS: 10.0000 mg | ORAL_TABLET | Freq: Every day | ORAL | 1 refills | Status: DC

## 2019-06-01 ENCOUNTER — Encounter: Payer: Self-pay | Admitting: Nurse Practitioner

## 2019-06-01 ENCOUNTER — Other Ambulatory Visit: Payer: Self-pay

## 2019-06-01 ENCOUNTER — Ambulatory Visit (INDEPENDENT_AMBULATORY_CARE_PROVIDER_SITE_OTHER): Payer: Self-pay | Admitting: Nurse Practitioner

## 2019-06-01 ENCOUNTER — Telehealth: Payer: Self-pay

## 2019-06-01 DIAGNOSIS — K5900 Constipation, unspecified: Secondary | ICD-10-CM | POA: Insufficient documentation

## 2019-06-01 DIAGNOSIS — Z0000 Encounter for general adult medical examination without abnormal findings: Secondary | ICD-10-CM

## 2019-06-01 DIAGNOSIS — K649 Unspecified hemorrhoids: Secondary | ICD-10-CM

## 2019-06-01 MED ORDER — PEG 3350-KCL-NA BICARB-NACL 420 G PO SOLR: 4000.0000 mL | ORAL | 0 refills | Status: DC

## 2019-06-01 NOTE — Assessment & Plan Note (Signed)
The patient is currently due for colonoscopy given her age.  She is 54 years old and has never had a colonoscopy before.  She is agreeable to proceed.  Return for follow-up in 6 months or as recommended after colonoscopy.  Proceed with TCS with Dr. Jena Gauss in near future: the risks, benefits, and alternatives have been discussed with the patient in detail. The patient states understanding and desires to proceed.  The patient does not on any anticoagulants, anxiolytics, chronic pain medications, antidepressants, antidiabetics, or iron supplements.  Denies alcohol and drug use.  Conscious sedation should be adequate for her procedure.

## 2019-06-01 NOTE — Telephone Encounter (Signed)
EG advised diagnosis for TCS is rectal bleeding.

## 2019-06-01 NOTE — Assessment & Plan Note (Signed)
The patient describes lifelong constipation.  Has not tried anything for constipation.  This point I do not think she needs prescription medication.  I have her take Colace 100 mg once daily and MiraLAX 1-2 times a day as needed for no bowel movement in 2 or more days.  If this is not effective we can stop of her regimen.  Follow-up in 6 months.

## 2019-06-01 NOTE — Progress Notes (Signed)
Primary Care Physician:  Antony Blackbird, MD Primary Gastroenterologist:  Dr.   Laurel Dimmer Complaint  Patient presents with  . Hemorrhoids    occas bleeding  . Constipation    occas    HPI:   Brenda [name] is a 54 y.o. female who presents on referral from primary care for follow-up on hemorrhoids as well as screening colonoscopy.  Reviewed information associated with referral including primary care office visit dated 04/23/2019 which was for multiple conditions.  She was previously recommended to follow-up with GI due to rectal bleeding with known hemorrhoids.  Blood in her stool no further issues with hemorrhoids.  She was referred to GI for follow-up of hemorrhoids and screening for colon cancer.  No history of colonoscopy found in our system.  Today she states she's doing ok overall. She has noted hemorrhoids since last April. Many people in her family have them. Has occasional bleeding about once every few weeks. Blood is bright red and in the commode. No pain, itching, irritation. Sometimes they prolapse a little and become uncomfortable; she will self-reduce them. Has tried OTC hemorrhoid suppositories with no relief. Will sit on the commode more than 5 minutes. Has always had constipation, sometimes stools are hard and sometimes not. Almost always has to strain. Has not tried anything for constipation. Has associated bloating which improves after a bowel movement. Is changing her diet due to elevated cholesterol. Incorporating more whole grains; eats about 2 servings of veggies a day, doesn't really like fruit. Typically has a bowel movement every 1-3 days. Denies abdominal pain, N/V, melena, fever, chills, unintentional weight loss. Denies URI or flu-like symptoms. Denies loss of sense of taste or smell. Denies chest pain, dyspnea, dizziness, lightheadedness, syncope, near syncope. Denies any other upper or lower GI symptoms.  Past Medical History:  Diagnosis Date  . Abnormal Pap  smear   . Carpal tunnel syndrome   . Hypercholesterolemia   . Hypertension     Past Surgical History:  Procedure Laterality Date  . LEEP      Current Outpatient Medications  Medication Sig Dispense Refill  . amLODipine (NORVASC) 10 MG tablet Take 1 tablet (10 mg total) by mouth daily. 90 tablet 1  . simvastatin (ZOCOR) 10 MG tablet Take 1 tablet (10 mg total) by mouth daily. 30 tablet 6   No current facility-administered medications for this visit.    Allergies as of 06/01/2019  . (No Known Allergies)    Family History  Problem Relation Age of Onset  . Hypertension Mother   . Hypertension Father   . Colon cancer Neg Hx     Social History   Socioeconomic History  . Marital status: Single    Spouse name: Not on file  . Number of children: 3  . Years of education: Not on file  . Highest education level: Not on file  Occupational History  . Not on file  Tobacco Use  . Smoking status: Never Smoker  . Smokeless tobacco: Never Used  Substance and Sexual Activity  . Alcohol use: No  . Drug use: No  . Sexual activity: Not Currently    Birth control/protection: None  Other Topics Concern  . Not on file  Social History Narrative   Employment: housekeeping; from [place].   Social Determinants of Health   Financial Resource Strain:   . Difficulty of Paying Living Expenses: Not on file  Food Insecurity:   . Worried About Charity fundraiser in the Last Year: Not  on file  . Ran Out of Food in the Last Year: Not on file  Transportation Needs:   . Lack of Transportation (Medical): Not on file  . Lack of Transportation (Non-Medical): Not on file  Physical Activity: Insufficiently Active  . Days of Exercise per Week: 2 days  . Minutes of Exercise per Session: 20 min  Stress:   . Feeling of Stress : Not on file  Social Connections:   . Frequency of Communication with Friends and Family: Not on file  . Frequency of Social Gatherings with Friends and Family: Not on file   . Attends Religious Services: Not on file  . Active Member of Clubs or Organizations: Not on file  . Attends Banker Meetings: Not on file  . Marital Status: Not on file  Intimate Partner Violence:   . Fear of Current or Ex-Partner: Not on file  . Emotionally Abused: Not on file  . Physically Abused: Not on file  . Sexually Abused: Not on file    Review of Systems: General: Negative for anorexia, weight loss, fever, chills, fatigue, weakness. ENT: Negative for hoarseness, difficulty swallowing. CV: Negative for chest pain, angina, palpitations, peripheral edema.  Respiratory: Negative for dyspnea at rest, cough, sputum, wheezing.  GI: See history of present illness.  MS: Negative for joint pain, low back pain.  Derm: Negative for rash or itching.  Endo: Negative for unusual weight change.  Heme: Negative for bruising or bleeding. Allergy: Negative for rash or hives.    Physical Exam: BP 121/72   Pulse 78   Temp (!) 97.3 F (36.3 C) (Oral)   Ht 5' (1.524 m)   Wt 129 lb (58.5 kg)   LMP 07/28/2016   BMI 25.19 kg/m  General:   Alert and oriented. Pleasant and cooperative. Well-nourished and well-developed.  Head:  Normocephalic and atraumatic. Eyes:  Without icterus, sclera clear and conjunctiva pink.  Ears:  Normal auditory acuity. Cardiovascular:  S1, S2 present without murmurs appreciated. Extremities without clubbing or edema. Respiratory:  Clear to auscultation bilaterally. No wheezes, rales, or rhonchi. No distress.  Gastrointestinal:  +BS, soft, non-tender and non-distended. No HSM noted. No guarding or rebound. No masses appreciated.  Rectal:  Deferred  Musculoskalatal:  Symmetrical without gross deformities. Neurologic:  Alert and oriented x4;  grossly normal neurologically. Psych:  Alert and cooperative. Normal mood and affect. Heme/Lymph/Immune: No excessive bruising noted.    06/01/2019 2:24 PM   Disclaimer: This note was dictated with voice  recognition software. Similar sounding words can inadvertently be transcribed and may not be corrected upon review.

## 2019-06-01 NOTE — Progress Notes (Signed)
Cc'ed to pcp °

## 2019-06-01 NOTE — Patient Instructions (Signed)
Your health issues we discussed today were:   Constipation: 1. Start taking Colace 100 mg (available over-the-counter) once a day 2. Use MiraLAX 1 dose once a day or twice a day, as needed for no bowel movement in 2 or more days 3. Call us if this does not help your constipation  Hemorrhoids with rectal bleeding: 1. I have sent in a prescription for Anusol rectal cream.  You can apply this up to twice a day for up to 10 days at a time for hemorrhoid symptoms or hemorrhoid bleeding 2. Call us if you have any worsening or severe symptoms including worsening bleeding  Need for colonoscopy: 1. We will schedule your procedure for you 2. Further recommendations will be made after your colonoscopy  Overall I recommend:  1. Continue your other current medications 2. Return for follow-up in 6 months 3. Call us if you have any questions or concerns before then   ---------------------------------------------------------------   COVID-19 Vaccine Information can be found at: [email or web address] For questions related to vaccine distribution or appointments, please email [email or web address] or call [phone].    ---------------------------------------------------------------  At [facility] we value your feedback. You may receive a survey about your visit today. Please share your experience as we strive to create trusting relationships with our patients to provide genuine, compassionate, quality care.  We appreciate your understanding and patience as we review any laboratory studies, imaging, and other diagnostic tests that are ordered as we care for you. Our office policy is 5 business days for review of these results, and any emergent or urgent results are addressed in a timely manner for your best interest. If you do not hear from our office in 1 week, please contact us.   We also encourage the use of MyChart, which  contains your medical information for your review as well. If you are not enrolled in this feature, an access code is on this after visit summary for your convenience. Thank you for allowing [place] to be involved in your care.  It was great to see you today!  I hope you have a Happy New Year!!

## 2019-06-01 NOTE — Assessment & Plan Note (Addendum)
Patient has had hemorrhoids for at least a year.  They occasionally protrude just to reduce them.  Is found over-the-counter topical therapy without relief.  I will send in Anusol rectal cream that she can apply up to twice a day for up to 10 days at a time.  Her hemorrhoids are associated with constipation, as per above.  She does have some intermittent/rare hematochezia, as per HPI.  She is due for colonoscopy given her age this will allow further evaluation.  Further constipation management above.  Follow-up in 6 months.

## 2019-06-04 ENCOUNTER — Telehealth: Payer: Self-pay | Admitting: Family Medicine

## 2019-06-04 DIAGNOSIS — I10 Essential (primary) hypertension: Secondary | ICD-10-CM

## 2019-06-04 MED ORDER — AMLODIPINE BESYLATE 5 MG PO TABS: 5.0000 mg | ORAL_TABLET | Freq: Every day | ORAL | 3 refills | Status: DC

## 2019-06-04 NOTE — Telephone Encounter (Signed)
OK, if BP is still high you could also see if 5 mg twice daily would work without raising the blood pressure

## 2019-06-04 NOTE — Telephone Encounter (Signed)
Cause of ankle swelling is likely attributed to increase in amlodipine dose. Call placed to patient. Was unable to reach her. Left HIPAA compliant VM to return call. I have decreased dose back to the 5 mg and sent rx to her pharmacy. She has an upcoming BP check 06/18/2019 with me.

## 2019-06-04 NOTE — Telephone Encounter (Signed)
Patient stated that she thinks she is having a reaction to her bp medication. Stated that her ankles have been really swollen. Please fu at your earliest convenience. Patient stated that she usually would take Lisinopril.   amLODipine (NORVASC) 10 MG tablet [[id number]]

## 2019-06-04 NOTE — Telephone Encounter (Signed)
Patient returned called.   She is experiencing bilateral ankle edema. No calf or lower leg swelling, erythema, or pain. Denies chest pain, chest pressure, palpitations, or shortness of breath. This occurred shortly after increasing to 10 mg dose of amlodipine. She did not take the medication today and the swelling has improved. I recommended that she decrease to 5 mg daily. She did not experience swelling with this dose. Additionally, she has a check coming up on 06/18/19 and we can evaluate the need for continued regimen adjustment at that appointment.

## 2019-06-04 NOTE — Telephone Encounter (Signed)
Verified with patient the previous message.   Please advise.

## 2019-06-18 ENCOUNTER — Ambulatory Visit: Payer: Self-pay | Attending: Family Medicine | Admitting: Pharmacist

## 2019-06-18 ENCOUNTER — Other Ambulatory Visit: Payer: Self-pay

## 2019-06-18 VITALS — BP 131/80 | HR 86

## 2019-06-18 DIAGNOSIS — I10 Essential (primary) hypertension: Secondary | ICD-10-CM

## 2019-06-18 NOTE — Progress Notes (Addendum)
   S:    Patient arrives in good spirits.    Presents to the clinic for hypertension evaluation, counseling, and management.  Patient was referred and last seen by Primary Care Provider on 04/23/19. I saw her on 1/7/21and increased amlodipine d/t above goal BP. Unfortunately, pt developed bilateral ankle edema. I instructed her to decrease back down to 5 mg on 06/04/19. Since then, pt reports resolution of swelling.  Denies chest pain, dyspnea, headache, or blurred vision. Denies BLE edema.   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 Family / Social history:  - FHx: HTN - Tobacco: never smoker - Alcohol: denies use  O:  Vitals:   06/18/19 1349  BP: 131/80  Pulse: 86   Home BP readings: has not checked since starting amlodipine  Last 3 Office BP readings: BP Readings from Last 3 Encounters:  06/18/19 131/80  06/01/19 121/72  05/21/19 139/86   BMET    Component Value Date/Time   NA 142 04/23/2019 0934   K 4.6 04/23/2019 0934   CL 106 04/23/2019 0934   CO2 21 04/23/2019 0934   GLUCOSE 91 04/23/2019 0934   BUN 13 04/23/2019 0934   CREATININE 0.73 04/23/2019 0934   CALCIUM 9.8 04/23/2019 0934   GFRNONAA 96 04/23/2019 0934   GFRAA 110 04/23/2019 0934   Renal function: CrCl cannot be calculated (Patient's most recent lab result is older than the maximum 21 days allowed.).  Clinical ASCVD: No  The 10-year ASCVD risk score Denman George DC Jr., et al., 2013) is: 1.3%   Values used to calculate the score:     Age: 54 years     Sex: Female     Is Non-Hispanic African American: No     Diabetic: No     Tobacco smoker: No     Systolic Blood Pressure: 131 mmHg     Is BP treated: Yes  HDL Cholesterol: 94 mg/dL     Total Cholesterol: 242 mg/dL  A/P: Hypertension longstanding currently at goal on current medications. BP Goal = <130/80 mmHg. Patient confirms adherence to medications.  -Continued current regimen.  -Counseled on lifestyle modifications for blood pressure control including reduced dietary sodium, increased exercise, adequate sleep  Results reviewed and written information provided.   Total time in face-to-face counseling 15 minutes.   F/U Clinic Visit in 1 month.  Butch Penny, PharmD, CPP Clinical Pharmacist [facility] [phone]

## 2019-08-10 ENCOUNTER — Other Ambulatory Visit (HOSPITAL_COMMUNITY)
Admission: RE | Admit: 2019-08-10 | Discharge: 2019-08-10 | Disposition: A | Discharge status: Home / Self Care | Payer: HRSA Program | Source: Ambulatory Visit | Attending: Internal Medicine | Admitting: Internal Medicine

## 2019-08-10 ENCOUNTER — Other Ambulatory Visit: Payer: Self-pay

## 2019-08-10 DIAGNOSIS — Z20822 Contact with and (suspected) exposure to covid-19: Secondary | ICD-10-CM | POA: Diagnosis not present

## 2019-08-10 DIAGNOSIS — Z01812 Encounter for preprocedural laboratory examination: Secondary | ICD-10-CM | POA: Diagnosis present

## 2019-08-10 LAB — SARS CORONAVIRUS 2 (TAT 6-24 HRS): SARS Coronavirus 2: NEGATIVE

## 2019-08-12 ENCOUNTER — Encounter (HOSPITAL_COMMUNITY): Payer: Self-pay | Admitting: Internal Medicine

## 2019-08-12 ENCOUNTER — Other Ambulatory Visit: Payer: Self-pay

## 2019-08-12 ENCOUNTER — Encounter (HOSPITAL_COMMUNITY)
Admission: RE | Disposition: A | Discharge status: Home / Self Care | Payer: Self-pay | Source: Home / Self Care | Attending: Internal Medicine

## 2019-08-12 ENCOUNTER — Ambulatory Visit (HOSPITAL_COMMUNITY)
Admission: RE | Admit: 2019-08-12 | Discharge: 2019-08-12 | Disposition: A | Discharge status: Home / Self Care | Payer: Self-pay | Attending: Internal Medicine | Admitting: Internal Medicine

## 2019-08-12 DIAGNOSIS — Z8249 Family history of ischemic heart disease and other diseases of the circulatory system: Secondary | ICD-10-CM | POA: Insufficient documentation

## 2019-08-12 DIAGNOSIS — I10 Essential (primary) hypertension: Secondary | ICD-10-CM | POA: Insufficient documentation

## 2019-08-12 DIAGNOSIS — K649 Unspecified hemorrhoids: Secondary | ICD-10-CM | POA: Insufficient documentation

## 2019-08-12 DIAGNOSIS — K644 Residual hemorrhoidal skin tags: Secondary | ICD-10-CM | POA: Insufficient documentation

## 2019-08-12 DIAGNOSIS — L918 Other hypertrophic disorders of the skin: Secondary | ICD-10-CM | POA: Insufficient documentation

## 2019-08-12 DIAGNOSIS — Z79899 Other long term (current) drug therapy: Secondary | ICD-10-CM | POA: Insufficient documentation

## 2019-08-12 DIAGNOSIS — Z1211 Encounter for screening for malignant neoplasm of colon: Secondary | ICD-10-CM | POA: Insufficient documentation

## 2019-08-12 DIAGNOSIS — E7800 Pure hypercholesterolemia, unspecified: Secondary | ICD-10-CM | POA: Insufficient documentation

## 2019-08-12 DIAGNOSIS — K621 Rectal polyp: Secondary | ICD-10-CM | POA: Insufficient documentation

## 2019-08-12 HISTORY — PX: COLONOSCOPY: SHX5424

## 2019-08-12 SURGERY — COLONOSCOPY
Anesthesia: Moderate Sedation

## 2019-08-12 MED ORDER — MIDAZOLAM HCL 5 MG/5ML IJ SOLN
INTRAMUSCULAR | Status: AC
  Filled 2019-08-12: qty 10

## 2019-08-12 MED ORDER — EPINEPHRINE 1 MG/10ML IJ SOSY
PREFILLED_SYRINGE | INTRAMUSCULAR | Status: AC
  Filled 2019-08-12: qty 10

## 2019-08-12 MED ORDER — MEPERIDINE HCL 100 MG/ML IJ SOLN
INTRAMUSCULAR | Status: DC | PRN
  Administered 2019-08-12: 25 mg via INTRAVENOUS
  Administered 2019-08-12: 15 mg via INTRAVENOUS

## 2019-08-12 MED ORDER — ONDANSETRON HCL 4 MG/2ML IJ SOLN
INTRAMUSCULAR | Status: DC | PRN
  Administered 2019-08-12: 4 mg via INTRAVENOUS

## 2019-08-12 MED ORDER — MEPERIDINE HCL 50 MG/ML IJ SOLN
INTRAMUSCULAR | Status: AC
  Filled 2019-08-12: qty 1

## 2019-08-12 MED ORDER — STERILE WATER FOR IRRIGATION IR SOLN
Status: DC | PRN
  Administered 2019-08-12: 2.5 mL

## 2019-08-12 MED ORDER — MIDAZOLAM HCL 5 MG/5ML IJ SOLN
INTRAMUSCULAR | Status: DC | PRN
  Administered 2019-08-12 (×3): 1 mg via INTRAVENOUS
  Administered 2019-08-12: 2 mg via INTRAVENOUS
  Administered 2019-08-12 (×2): 1 mg via INTRAVENOUS

## 2019-08-12 MED ORDER — SODIUM CHLORIDE 0.9 % IV SOLN: INTRAVENOUS | Status: DC

## 2019-08-12 MED ORDER — ONDANSETRON HCL 4 MG/2ML IJ SOLN
INTRAMUSCULAR | Status: AC
  Filled 2019-08-12: qty 2

## 2019-08-12 NOTE — Discharge Instructions (Signed)
Colonoscopy Discharge Instructions  Read the instructions outlined below and refer to this sheet in the next few weeks. These discharge instructions provide you with general information on caring for yourself after you leave the hospital. Your doctor may also give you specific instructions. While your treatment has been planned according to the most current medical practices available, unavoidable complications occasionally occur. If you have any problems or questions after discharge, call Dr. Jena Gauss at [phone]. ACTIVITY  You may resume your regular activity, but move at a slower pace for the next 24 hours.   Take frequent rest periods for the next 24 hours.   Walking will help get rid of the air and reduce the bloated feeling in your belly (abdomen).   No driving for 24 hours (because of the medicine (anesthesia) used during the test).    Do not sign any important legal documents or operate any machinery for 24 hours (because of the anesthesia used during the test).  NUTRITION  Drink plenty of fluids.   You may resume your normal diet as instructed by your doctor.   Begin with a light meal and progress to your normal diet. Heavy or fried foods are harder to digest and may make you feel sick to your stomach (nauseated).   Avoid alcoholic beverages for 24 hours or as instructed.  MEDICATIONS  You may resume your normal medications unless your doctor tells you otherwise.  WHAT YOU CAN EXPECT TODAY  Some feelings of bloating in the abdomen.   Passage of more gas than usual.   Spotting of blood in your stool or on the toilet paper.  IF YOU HAD POLYPS REMOVED DURING THE COLONOSCOPY:  No aspirin products for 7 days or as instructed.   No alcohol for 7 days or as instructed.   Eat a soft diet for the next 24 hours.  FINDING OUT THE RESULTS OF YOUR TEST Not all test results are available during your visit. If your test results are not back during the visit, make an appointment  with your caregiver to find out the results. Do not assume everything is normal if you have not heard from your caregiver or the medical facility. It is important for you to follow up on all of your test results.  SEEK IMMEDIATE MEDICAL ATTENTION IF:  You have more than a spotting of blood in your stool.   Your belly is swollen (abdominal distention).   You are nauseated or vomiting.   You have a temperature over 101.   You have abdominal pain or discomfort that is severe or gets worse throughout the day.   Colon polyp and hemorrhoid information provided  Pamphlet on hemorrhoid banding provided  Avoid straining for the next 7 days  Use MiraLAX as needed for constipation take over-the-counter Colace 100 mg twice daily for the next 5 days to keep your stool soft  No MRI until clip gone  Further recommendations to follow pending review of pathology report  Office visit with AB in 3 months to consider hemorrhoid banding  Further recommendations to follow pending review of pathology report  At patient request, I called Singe Cuin at [phone] - no one there. I called second number [phone] contact and reviewed findings and recommendations   Colon Polyps  Polyps are tissue growths inside the body. Polyps can grow in many places, including the large intestine (colon). A polyp may be a round bump or a mushroom-shaped growth. You could have one polyp or several. Most colon polyps  are noncancerous (benign). However, some colon polyps can become cancerous over time. Finding and removing the polyps early can help prevent this. What are the causes? The exact cause of colon polyps is not known. What increases the risk? You are more likely to develop this condition if you:  Have a family history of colon cancer or colon polyps.  Are older than 82 or older than 45 if you are African American.  Have inflammatory bowel disease, such as ulcerative colitis or Crohn's  disease.  Have certain hereditary conditions, such as: ? Familial adenomatous polyposis. ? Lynch syndrome. ? Turcot syndrome. ? Peutz-Jeghers syndrome.  Are overweight.  Smoke cigarettes.  Do not get enough exercise.  Drink too much alcohol.  Eat a diet that is high in fat and red meat and low in fiber.  Had childhood cancer that was treated with abdominal radiation. What are the signs or symptoms? Most polyps do not cause symptoms. If you have symptoms, they may include:  Blood coming from your rectum when having a bowel movement.  Blood in your stool. The stool may look dark red or black.  Abdominal pain.  A change in bowel habits, such as constipation or diarrhea. How is this diagnosed? This condition is diagnosed with a colonoscopy. This is a procedure in which a lighted, flexible scope is inserted into the anus and then passed into the colon to examine the area. Polyps are sometimes found when a colonoscopy is done as part of routine cancer screening tests. How is this treated? Treatment for this condition involves removing any polyps that are found. Most polyps can be removed during a colonoscopy. Those polyps will then be tested for cancer. Additional treatment may be needed depending on the results of testing. Follow these instructions at home: Lifestyle  Maintain a healthy weight, or lose weight if recommended by your health care provider.  Exercise every day or as told by your health care provider.  Do not use any products that contain nicotine or tobacco, such as cigarettes and e-cigarettes. If you need help quitting, ask your health care provider.  If you drink alcohol, limit how much you have: ? 0-1 drink a day for women. ? 0-2 drinks a day for men.  Be aware of how much alcohol is in your drink. In the [place] one drink equals one 12 oz bottle of beer (355 mL), one 5 oz glass of wine (148 mL), or one 1 oz shot of hard liquor (44 mL). Eating and  drinking   Eat foods that are high in fiber, such as fruits, vegetables, and whole grains.  Eat foods that are high in calcium and vitamin D, such as milk, cheese, yogurt, eggs, liver, fish, and broccoli.  Limit foods that are high in fat, such as fried foods and desserts.  Limit the amount of red meat and processed meat you eat, such as hot dogs, sausage, bacon, and lunch meats. General instructions  Keep all follow-up visits as told by your health care provider. This is important. ? This includes having regularly scheduled colonoscopies. ? Talk to your health care provider about when you need a colonoscopy. Contact a health care provider if:  You have new or worsening bleeding during a bowel movement.  You have new or increased blood in your stool.  You have a change in bowel habits.  You lose weight for no known reason. Summary  Polyps are tissue growths inside the body. Polyps can grow in many places, including the  colon.  Most colon polyps are noncancerous (benign), but some can become cancerous over time.  This condition is diagnosed with a colonoscopy.  Treatment for this condition involves removing any polyps that are found. Most polyps can be removed during a colonoscopy. This information is not intended to replace advice given to you by your health care provider. Make sure you discuss any questions you have with your health care provider. Document Revised: 08/15/2017 Document Reviewed: 08/15/2017 Elsevier Patient Education  [place].   Hemorrhoids Hemorrhoids are swollen veins that may develop:  In the butt (rectum). These are called internal hemorrhoids.  Around the opening of the butt (anus). These are called external hemorrhoids. Hemorrhoids can cause pain, itching, or bleeding. Most of the time, they do not cause serious problems. They usually get better with diet changes, lifestyle changes, and other home treatments. What are the causes? This  condition may be caused by:  Having trouble pooping (constipation).  Pushing hard (straining) to poop.  Watery poop (diarrhea).  Pregnancy.  Being very overweight (obese).  Sitting for long periods of time.  Heavy lifting or other activity that causes you to strain.  Anal sex.  Riding a bike for a long period of time. What are the signs or symptoms? Symptoms of this condition include:  Pain.  Itching or soreness in the butt.  Bleeding from the butt.  Leaking poop.  Swelling in the area.  One or more lumps around the opening of your butt. How is this diagnosed? A doctor can often diagnose this condition by looking at the affected area. The doctor may also:  Do an exam that involves feeling the area with a gloved hand (digital rectal exam).  Examine the area inside your butt using a small tube (anoscope).  Order blood tests. This may be done if you have lost a lot of blood.  Have you get a test that involves looking inside the colon using a flexible tube with a camera on the end (sigmoidoscopy or colonoscopy). How is this treated? This condition can usually be treated at home. Your doctor may tell you to change what you eat, make lifestyle changes, or try home treatments. If these do not help, procedures can be done to remove the hemorrhoids or make them smaller. These may involve:  Placing rubber bands at the base of the hemorrhoids to cut off their blood supply.  Injecting medicine into the hemorrhoids to shrink them.  Shining a type of light energy onto the hemorrhoids to cause them to fall off.  Doing surgery to remove the hemorrhoids or cut off their blood supply. Follow these instructions at home: Eating and drinking   Eat foods that have a lot of fiber in them. These include whole grains, beans, nuts, fruits, and vegetables.  Ask your doctor about taking products that have added fiber (fibersupplements).  Reduce the amount of fat in your diet. You can  do this by: ? Eating low-fat dairy products. ? Eating less red meat. ? Avoiding processed foods.  Drink enough fluid to keep your pee (urine) pale yellow. Managing pain and swelling   Take a warm-water bath (sitz bath) for 20 minutes to ease pain. Do this 3-4 times a day. You may do this in a bathtub or using a portable sitz bath that fits over the toilet.  If told, put ice on the painful area. It may be helpful to use ice between your warm baths. ? Put ice in a plastic bag. ?  Place a towel between your skin and the bag. ? Leave the ice on for 20 minutes, 2-3 times a day. General instructions  Take over-the-counter and prescription medicines only as told by your doctor. ? Medicated creams and medicines may be used as told.  Exercise often. Ask your doctor how much and what kind of exercise is best for you.  Go to the bathroom when you have the urge to poop. Do not wait.  Avoid pushing too hard when you poop.  Keep your butt dry and clean. Use wet toilet paper or moist towelettes after pooping.  Do not sit on the toilet for a long time.  Keep all follow-up visits as told by your doctor. This is important. Contact a doctor if you:  Have pain and swelling that do not get better with treatment or medicine.  Have trouble pooping.  Cannot poop.  Have pain or swelling outside the area of the hemorrhoids. Get help right away if you have:  Bleeding that will not stop. Summary  Hemorrhoids are swollen veins in the butt or around the opening of the butt.  They can cause pain, itching, or bleeding.  Eat foods that have a lot of fiber in them. These include whole grains, beans, nuts, fruits, and vegetables.  Take a warm-water bath (sitz bath) for 20 minutes to ease pain. Do this 3-4 times a day. This information is not intended to replace advice given to you by your health care provider. Make sure you discuss any questions you have with your health care provider. Document  Revised: 05/08/2018 Document Reviewed: 09/19/2017 Elsevier Patient Education  2020 [employer]  PATIENT INSTRUCTIONS POST-ANESTHESIA  IMMEDIATELY FOLLOWING SURGERY:  Do not drive or operate machinery for the first twenty four hours after surgery.  Do not make any important decisions for twenty four hours after surgery or while taking narcotic pain medications or sedatives.  If you develop intractable nausea and vomiting or a severe headache please notify your doctor immediately.  FOLLOW-UP:  Please make an appointment with your surgeon as instructed. You do not need to follow up with anesthesia unless specifically instructed to do so.  WOUND CARE INSTRUCTIONS (if applicable):  Keep a dry clean dressing on the anesthesia/puncture wound site if there is drainage.  Once the wound has quit draining you may leave it open to air.  Generally you should leave the bandage intact for twenty four hours unless there is drainage.  If the epidural site drains for more than 36-48 hours please call the anesthesia department.  QUESTIONS?:  Please feel free to call your physician or the hospital operator if you have any questions, and they will be happy to assist you.

## 2019-08-12 NOTE — H&P (Signed)
@LOGO @   Primary Care Physician:  , MD Primary Gastroenterologist:  Dr. Cain Saupe  Pre-Procedure History & Physical: HPI:  Brenda [name] is a 54 y.o. female is here for a screening colonoscopy.  Complains of protruding hemorrhoids from time to time in spite of successful management for constipation.  Past Medical History:  Diagnosis Date  . Abnormal Pap smear   . Carpal tunnel syndrome   . Hypercholesterolemia   . Hypertension     Past Surgical History:  Procedure Laterality Date  . LEEP      Prior to Admission medications   Medication Sig Start Date End Date Taking? Authorizing Provider  amLODipine (NORVASC) 5 MG tablet Take 1 tablet (5 mg total) by mouth daily. 06/04/19  Yes Fulp, Cammie, MD  polyethylene glycol-electrolytes (TRILYTE) 420 g solution Take 4,000 mLs by mouth as directed. 06/01/19  Yes Nga Rabon, 06/03/19, MD  simvastatin (ZOCOR) 10 MG tablet Take 1 tablet (10 mg total) by mouth daily. 02/11/19  Yes Fulp, 02/13/19, MD    Allergies as of 06/01/2019  . (No Known Allergies)    Family History  Problem Relation Age of Onset  . Hypertension Mother   . Hypertension Father   . Colon cancer Neg Hx     Social History   Socioeconomic History  . Marital status: Single    Spouse name: Not on file  . Number of children: 3  . Years of education: Not on file  . Highest education level: Not on file  Occupational History  . Not on file  Tobacco Use  . Smoking status: Never Smoker  . Smokeless tobacco: Never Used  Substance and Sexual Activity  . Alcohol use: No  . Drug use: No  . Sexual activity: Not Currently    Birth control/protection: None  Other Topics Concern  . 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   Social Determinants of Health   Financial Resource Strain:   . Difficulty of Paying Living Expenses:   Food Insecurity:   . Worried About [place] in the Last Year:   . Programme researcher, broadcasting/film/video in  the Last Year:   Transportation Needs:   . Barista (Medical):   Freight forwarder Lack of Transportation (Non-Medical):   Physical Activity: Insufficiently Active  . Days of Exercise per Week: 2 days  . Minutes of Exercise per Session: 20 min  Stress:   . Feeling of Stress :   Social Connections:   . Frequency of Communication with Friends and Family:   . Frequency of Social Gatherings with Friends and Family:   . Attends Religious Services:   . Active Member of Clubs or Organizations:   . Attends [employer] Meetings:   Banker Marital Status:   Intimate Partner Violence:   . Fear of Current or Ex-Partner:   . Emotionally Abused:   [employer] Physically Abused:   . Sexually Abused:     Review of Systems: See HPI, otherwise negative ROS  Physical Exam: BP 135/78   Pulse 77   Temp 98.4 F (36.9 C) (Oral)   Resp 17   Ht 5' (1.524 m)   Wt 59 kg   LMP 07/28/2016   SpO2 98%   BMI 25.39 kg/m  General:   Alert,  Well-developed, well-nourished, pleasant and cooperative in NAD Lungs:  Clear throughout to auscultation.   No wheezes, crackles, or rhonchi. No acute distress. Heart:  Regular rate and rhythm; no  murmurs, clicks, rubs,  or gallops. Abdomen:  Soft, nontender and nondistended. No masses, hepatosplenomegaly or hernias noted. Normal bowel sounds, without guarding, and without rebound.    Impression/Plan: Brenda [name] is now here to undergo a screening colonoscopy.  She does complain of protruding hemorrhoids. First Ever average rescreening colonoscopy today per plan. Risks, benefits, limitations, imponderables and alternatives regarding colonoscopy have been reviewed with the patient. Questions have been answered. All parties agreeable.     Notice:  This dictation was prepared with Dragon dictation along with smaller phrase technology. Any transcriptional errors that result from this process are unintentional and may not be corrected upon review.

## 2019-08-12 NOTE — Op Note (Signed)
[facility] Patient Name: Brenda [name] Procedure Date: 08/12/2019 1:18 PM MRN: [id number] Date of Birth: 1965/06/12 Attending MD: Gennette Pac , MD CSN: [id number] Age: 54 Admit Type: Outpatient Procedure:                Colonoscopy Indications:              Screening for colorectal malignant neoplasm Providers:                Gennette Pac, MD, Nena Polio, RN, Burke Keels, Technician Referring MD:              Medicines:                Meperidine 40 mg IV, Midazolam 7 mg IV Complications:            No immediate complications. Estimated Blood Loss:     Estimated blood loss was minimal. Procedure:                Pre-Anesthesia Assessment:                           - Prior to the procedure, a History and Physical                            was performed, and patient medications and                            allergies were reviewed. The patient's tolerance of                            previous anesthesia was also reviewed. The risks                            and benefits of the procedure and the sedation                            options and risks were discussed with the patient.                            All questions were answered, and informed consent                            was obtained. Prior Anticoagulants: The patient has                            taken no previous anticoagulant or antiplatelet                            agents. ASA Grade Assessment: II - A patient with                            mild systemic disease. After reviewing the risks  and benefits, the patient was deemed in                            satisfactory condition to undergo the procedure.                           After obtaining informed consent, the colonoscope                            was passed under direct vision. Throughout the                            procedure, the patient's blood pressure, pulse, and                             oxygen saturations were monitored continuously. The                            CF-[id number] ([id number]) scope was introduced through                            the anus and advanced to the the cecum, identified                            by appendiceal orifice and ileocecal valve. The                            colonoscopy was performed without difficulty. The                            patient tolerated the procedure well. The quality                            of the bowel preparation was adequate. Scope In: 1:31:50 PM Scope Out: 1:58:48 PM Scope Withdrawal Time: 0 hours 20 minutes 31 seconds  Total Procedure Duration: 0 hours 26 minutes 58 seconds  Findings:      Patient had a relatively large skin tag on the right buttock; 2 small       grade 3 hemorrhoid tags.      A 7 mm polyp was found in the rectum. The polyp was sessile. This was       quite distal just inside the anal verge. Please see photos. The polyp       was removed with a hot snare. Resection and retrieval were complete.       Estimated blood loss was minimal.      The polypectomy crater tended to ooze; treated with 3 cc of 1-10,000       epinephrine injected submucosally. Additional cautery via snare was       utilized to seal the crater. Finally, 91) 360 clip was placed with good       hemostasis achieved Impression:               - One 7 mm polyp in the rectum, removed with a hot  snare. Resected and retrieved. Hemostasis maneuvers                            including clip placement as above. Remainder of                            rectum and colon appeared normal. Moderate Sedation:      Moderate (conscious) sedation was administered by the endoscopy nurse       and supervised by the endoscopist. The following parameters were       monitored: oxygen saturation, heart rate, blood pressure, respiratory       rate, EKG, adequacy of pulmonary ventilation, and response to  care.       Total physician intraservice time was 29 minutes. Recommendation:           - Patient has a contact number available for                            emergencies. The signs and symptoms of potential                            delayed complications were discussed with the                            patient. Return to normal activities tomorrow.                            Written discharge instructions were provided to the                            patient.                           - Advance diet as tolerated. Void straining .                            Utilize MiraLAX as needed for constipation Colace                            100 mg twice daily for the next 5 day. May or may                            not need future hemorrhoid banding. Follow-up on                            pathology. MRI until clip gone Procedure Code(s):        --- Professional ---                           [phone], Colonoscopy, flexible; with removal of                            tumor(s), polyp(s), or other lesion(s) by snare  technique                           [id number], Moderate sedation; each additional 15                            minutes intraservice time                           G0500, Moderate sedation services provided by the                            same physician or other qualified health care                            professional performing a gastrointestinal                            endoscopic service that sedation supports,                            requiring the presence of an independent trained                            observer to assist in the monitoring of the                            patient's level of consciousness and physiological                            status; initial 15 minutes of intra-service time;                            patient age 7 years or older (additional time may                            be reported with [phone], as  appropriate) Diagnosis Code(s):        --- Professional ---                           Z12.11, Encounter for screening for malignant                            neoplasm of colon                           K62.1, Rectal polyp CPT copyright 2019 American Medical Association. All rights reserved. The codes documented in this report are preliminary and upon coder review may  be revised to meet current compliance requirements. Cristopher Estimable. Kambry Takacs, MD Norvel Richards, MD 08/12/2019 2:12:20 PM This report has been signed electronically. Number of Addenda: 0

## 2019-08-14 ENCOUNTER — Encounter: Payer: Self-pay | Admitting: Internal Medicine

## 2019-08-14 LAB — SURGICAL PATHOLOGY

## 2019-08-21 ENCOUNTER — Ambulatory Visit: Payer: Self-pay | Admitting: Family Medicine

## 2019-09-03 ENCOUNTER — Encounter: Payer: Self-pay | Admitting: Family Medicine

## 2019-09-03 ENCOUNTER — Ambulatory Visit: Payer: Self-pay | Attending: Family Medicine | Admitting: Family Medicine

## 2019-09-03 ENCOUNTER — Other Ambulatory Visit: Payer: Self-pay

## 2019-09-03 DIAGNOSIS — G8929 Other chronic pain: Secondary | ICD-10-CM

## 2019-09-03 DIAGNOSIS — K089 Disorder of teeth and supporting structures, unspecified: Secondary | ICD-10-CM

## 2019-09-03 DIAGNOSIS — Z1231 Encounter for screening mammogram for malignant neoplasm of breast: Secondary | ICD-10-CM

## 2019-09-03 DIAGNOSIS — I10 Essential (primary) hypertension: Secondary | ICD-10-CM

## 2019-09-03 DIAGNOSIS — E785 Hyperlipidemia, unspecified: Secondary | ICD-10-CM

## 2019-09-03 MED ORDER — SIMVASTATIN 10 MG PO TABS: 10.0000 mg | ORAL_TABLET | Freq: Every day | ORAL | 6 refills | Status: DC

## 2019-09-03 NOTE — Progress Notes (Signed)
Virtual Visit via Telephone Note  I connected with Brenda [name], on 09/03/2019 at 11:27 AM by telephone due to the COVID-19 pandemic and verified that I am speaking with the correct person using two identifiers.   Consent: I discussed the limitations, risks, security and privacy concerns of performing an evaluation and management service by telephone and the availability of in person appointments. I also discussed with the patient that there may be a patient responsible charge related to this service. The patient expressed understanding and agreed to proceed.   Location of Patient: Home  Location of Provider: Clinic   Persons participating in Telemedicine visit: Brenda [name]-CMA Dr. Alvis Lemmings     History of Present Illness: 54 year old female patient of Dr Brenda [name] with a history of hypertension, hyperlipidemia who presents today for chronic disease management.   She previously had pedal edema when Amlodipine dose was increased to 10 mg but this resolved when her dose was decreased back to 5 mg. Systolic BP at home was 134 the last time she checked. She is compliant with her simvastatin and denies presence of myalgia or other adverse effects.  She is due for a mammogram and would like to be referred. Also complains of pain in her teeth at her, would like to be referred to a dentist. Past Medical History:  Diagnosis Date  . Abnormal Pap smear   . Carpal tunnel syndrome   . Hypercholesterolemia   . Hypertension    No Known Allergies  Current Outpatient Medications on File Prior to Visit  Medication Sig Dispense Refill  . amLODipine (NORVASC) 5 MG tablet Take 1 tablet (5 mg total) by mouth daily. 90 tablet 3  . simvastatin (ZOCOR) 10 MG tablet Take 1 tablet (10 mg total) by mouth daily. 30 tablet 6  . polyethylene glycol-electrolytes (TRILYTE) 420 g solution Take 4,000 mLs by mouth as directed. (Patient not taking: Reported on 09/03/2019) 4000 mL  0   No current facility-administered medications on file prior to visit.    Observations/Objective: Awake, alert, oriented x3 Not in acute distress  Assessment and Plan: 1. Essential hypertension Controlled Continue amlodipine Counseled on blood pressure goal of less than 130/80, low-sodium, DASH diet, medication compliance, 150 minutes of moderate intensity exercise per week. Discussed medication compliance, adverse effects.   2. Chronic dental pain - Ambulatory referral to Dentistry  3. Encounter for screening mammogram for malignant neoplasm of breast - MM 3D SCREEN BREAST BILATERAL; Future  4. Hyperlipidemia, unspecified hyperlipidemia type Uncontrolled from 04/2019 Continue simvastatin She will be due for lipid panel repeat at next visit   Follow Up Instructions: Return in about 1 month (around 10/03/2019) for Pap smear with PCP Dr. Jillyn [name].    I discussed the assessment and treatment plan with the patient. The patient was provided an opportunity to ask questions and all were answered. The patient agreed with the plan and demonstrated an understanding of the instructions.   The patient was advised to call back or seek an in-person evaluation if the symptoms worsen or if the condition fails to improve as anticipated.     I provided 12 minutes total of non-face-to-face time during this encounter including median intraservice time, reviewing previous notes, investigations, ordering medications, medical decision making, coordinating care and patient verbalized understanding at the end of the visit.     Hoy Register, MD, FAAFP. [facility] [place], [place] [phone]   09/03/2019, 11:27 AM

## 2019-09-09 ENCOUNTER — Other Ambulatory Visit: Payer: Self-pay | Admitting: Family Medicine

## 2019-09-09 DIAGNOSIS — Z1231 Encounter for screening mammogram for malignant neoplasm of breast: Secondary | ICD-10-CM

## 2019-10-08 ENCOUNTER — Ambulatory Visit: Payer: Self-pay | Admitting: Family Medicine

## 2019-10-20 ENCOUNTER — Encounter: Payer: Self-pay | Admitting: Gastroenterology

## 2019-10-20 ENCOUNTER — Ambulatory Visit (INDEPENDENT_AMBULATORY_CARE_PROVIDER_SITE_OTHER): Payer: Self-pay | Admitting: Gastroenterology

## 2019-10-20 ENCOUNTER — Other Ambulatory Visit: Payer: Self-pay

## 2019-10-20 VITALS — BP 137/85 | HR 63 | Temp 97.1°F | Ht 60.0 in | Wt 125.0 lb

## 2019-10-20 DIAGNOSIS — K649 Unspecified hemorrhoids: Secondary | ICD-10-CM

## 2019-10-20 NOTE — Patient Instructions (Signed)
Continue to avoid straining as you are doing.  Please call me if any further hemorrhoid symptoms, and we will do a banding.  Otherwise, we will see you as needed, and your next colonoscopy is in 10 years!  It was a pleasure to see you today. I want to create trusting relationships with patients to provide genuine, compassionate, and quality care. I value your feedback. If you receive a survey regarding your visit,  I greatly appreciate you taking time to fill this out.   Gelene Mink, PhD, ANP-BC [facility]

## 2019-10-20 NOTE — Progress Notes (Signed)
Referring Provider: Antony Blackbird, MD Primary Care Physician:  Antony Blackbird, MD Primary GI: Dr. Gala Romney   Chief Complaint  Patient presents with  . Hemorrhoids    f/u. Doing well    HPI:   Brenda [name] is a 54 y.o. female presenting today with a history of symptomatic hemorrhoids, s/p recent colonoscopy March 2021 with benign polyp, hemorrhoid tags, screening due in 10 years. She is here for routine follow-up.   1-2 colace per day. After TCS had rectal pain and bleeding. Applied ice for a few weeks and now resolved. BM daily, no straining. No abdominal pain. No N/V. No significant GERD symptoms. In past states she had prolapsing of tissue and had to reduce on own. Hasn't had any longer. No itching, burning, bleeding. Asymptomatic currently. Noticed much improvement with colace.   Past Medical History:  Diagnosis Date  . Abnormal Pap smear   . Carpal tunnel syndrome   . Hypercholesterolemia   . Hypertension     Past Surgical History:  Procedure Laterality Date  . COLONOSCOPY N/A 08/12/2019   Large skin tag right buttock, 2 small hemorrhoid tags, 7 mm rectal polyp sessile, oozing s/p resection requiring epi and cautery, s/p clip placement. Benign colon polyp. Screening in 10 years.   Marland Kitchen LEEP      Current Outpatient Medications  Medication Sig Dispense Refill  . amLODipine (NORVASC) 5 MG tablet Take 1 tablet (5 mg total) by mouth daily. 90 tablet 3  . simvastatin (ZOCOR) 10 MG tablet Take 1 tablet (10 mg total) by mouth daily. 30 tablet 6   No current facility-administered medications for this visit.    Allergies as of 10/20/2019  . (No Known Allergies)    Family History  Problem Relation Age of Onset  . Hypertension Mother   . Hypertension Father   . Colon cancer Neg Hx     Social History   Socioeconomic History  . Marital status: Single    Spouse name: Not on file  . Number of children: 3  . Years of education: Not on file  . Highest education  level: Not on file  Occupational History  . Not on file  Tobacco Use  . Smoking status: Never Smoker  . Smokeless tobacco: Never Used  Substance and Sexual Activity  . Alcohol use: No  . Drug use: No  . Sexual activity: Not Currently    Birth control/protection: None  Other Topics Concern  . Not on file  Social History Narrative   Employment: housekeeping; from [place].   Social Determinants of Health   Financial Resource Strain:   . Difficulty of Paying Living Expenses:   Food Insecurity:   . Worried About Charity fundraiser in the Last Year:   . Arboriculturist in the Last Year:   Transportation Needs:   . Film/video editor (Medical):   Marland Kitchen Lack of Transportation (Non-Medical):   Physical Activity: Insufficiently Active  . Days of Exercise per Week: 2 days  . Minutes of Exercise per Session: 20 min  Stress:   . Feeling of Stress :   Social Connections:   . Frequency of Communication with Friends and Family:   . Frequency of Social Gatherings with Friends and Family:   . Attends Religious Services:   . Active Member of Clubs or Organizations:   . Attends Archivist Meetings:   Marland Kitchen Marital Status:     Review of Systems: Gen: Denies fever, chills,  anorexia. Denies fatigue, weakness, weight loss.  CV: Denies chest pain, palpitations, syncope, peripheral edema, and claudication. Resp: Denies dyspnea at rest, cough, wheezing, coughing up blood, and pleurisy. GI: see HPI Derm: Denies rash, itching, dry skin Psych: Denies depression, anxiety, memory loss, confusion. No homicidal or suicidal ideation.  Heme: Denies bruising, bleeding, and enlarged lymph nodes.  Physical Exam: BP 137/85   Pulse 63   Temp (!) 97.1 F (36.2 C) (Oral)   Ht 5' (1.524 m)   Wt 125 lb (56.7 kg)   LMP 07/28/2016   BMI 24.41 kg/m  General:   Alert and oriented. No distress noted. Pleasant and cooperative.  Head:  Normocephalic and atraumatic. Eyes:  Conjuctiva clear without  scleral icterus. Mouth:  Mask in place Abdomen:  +BS, soft, non-tender and non-distended. No rebound or guarding. No HSM or masses noted. Msk:  Symmetrical without gross deformities. Normal posture. Extremities:  Without edema. Neurologic:  Alert and  oriented x4 Psych:  Alert and cooperative. Normal mood and affect.  ASSESSMENT: Brenda [name] is a elightful 54 y.o. female presenting today in follow-up after colonoscopy, with next screening due in 2031. Earlier this year, she had some question of symptomatic hemorrhoids, but she has had complete resolution with addition of colace, avoidance of straining. Shortly after colonoscopy, she did have rectal discomfort but this has resolved. We will hold off on banding as she has no symptoms whatsoever currently. She is to call with any concerns going forward, and we will see her again. Otherwise, return as needed, and continue the colace 1-2 per day.    PLAN:   Return prn  Call if symptoms return  Continue colace, avoid straining  Colonoscopy in 2031   Gelene Mink, PhD, ANP-BC [facility]

## 2019-10-28 ENCOUNTER — Other Ambulatory Visit: Payer: Self-pay

## 2019-10-28 DIAGNOSIS — Z1231 Encounter for screening mammogram for malignant neoplasm of breast: Secondary | ICD-10-CM

## 2019-10-29 ENCOUNTER — Ambulatory Visit: Payer: Self-pay | Attending: Family Medicine | Admitting: Family Medicine

## 2019-10-29 ENCOUNTER — Other Ambulatory Visit: Payer: Self-pay

## 2019-10-29 ENCOUNTER — Encounter: Payer: Self-pay | Admitting: Family Medicine

## 2019-10-29 VITALS — BP 119/73 | HR 61 | Ht 60.0 in | Wt 126.1 lb

## 2019-10-29 DIAGNOSIS — Z124 Encounter for screening for malignant neoplasm of cervix: Secondary | ICD-10-CM

## 2019-10-29 DIAGNOSIS — Z0000 Encounter for general adult medical examination without abnormal findings: Secondary | ICD-10-CM

## 2019-10-29 NOTE — Progress Notes (Signed)
Subjective:  Patient ID: Brenda [name], female    DOB: 03/10/1966  Age: 54 y.o. MRN: [id number]  CC: Gynecologic Exam   HPI Brenda [name] presents for an annual physical exam. She is scheduled for a mammogram next month. Up to date on Colonoscopy -07/2019. She has no vaginal discharge or pain.  Past Medical History:  Diagnosis Date  . Abnormal Pap smear   . Carpal tunnel syndrome   . Hypercholesterolemia   . Hypertension     Past Surgical History:  Procedure Laterality Date  . COLONOSCOPY N/A 08/12/2019   Large skin tag right buttock, 2 small hemorrhoid tags, 7 mm rectal polyp sessile, oozing s/p resection requiring epi and cautery, s/p clip placement. Benign colon polyp. Screening in 10 years.   Marland Kitchen LEEP      Family History  Problem Relation Age of Onset  . Hypertension Mother   . Hypertension Father   . Colon cancer Neg Hx     No Known Allergies  Outpatient Medications Prior to Visit  Medication Sig Dispense Refill  . amLODipine (NORVASC) 5 MG tablet Take 1 tablet (5 mg total) by mouth daily. 90 tablet 3  . simvastatin (ZOCOR) 10 MG tablet Take 1 tablet (10 mg total) by mouth daily. 30 tablet 6   No facility-administered medications prior to visit.     ROS Review of Systems  Constitutional: Negative for activity change, appetite change and fatigue.  HENT: Negative for congestion, sinus pressure and sore throat.   Eyes: Negative for visual disturbance.  Respiratory: Negative for cough, chest tightness, shortness of breath and wheezing.   Cardiovascular: Negative for chest pain and palpitations.  Gastrointestinal: Negative for abdominal distention, abdominal pain and constipation.  Endocrine: Negative for polydipsia.  Genitourinary: Negative for dysuria and frequency.  Musculoskeletal: Negative for arthralgias and back pain.  Skin: Negative for rash.  Neurological: Negative for tremors, light-headedness and numbness.  Hematological: Does not  bruise/bleed easily.  Psychiatric/Behavioral: Negative for agitation and behavioral problems.    Objective:  BP 119/73   Pulse 61   Ht 5' (1.524 m)   Wt 126 lb 1 oz (57.2 kg)   LMP 07/28/2016   SpO2 99%   BMI 24.62 kg/m   BP/Weight 10/29/2019 10/20/2019 9/47/6546  Systolic BP 503 546 568  Diastolic BP 73 85 67  Wt. (Lbs) 126.06 125 130  BMI 24.62 24.41 25.39      Physical Exam Constitutional:      General: She is not in acute distress.    Appearance: She is well-developed. She is not diaphoretic.  HENT:     Head: Normocephalic.     Right Ear: External ear normal.     Left Ear: External ear normal.     Nose: Nose normal.  Eyes:     Conjunctiva/sclera: Conjunctivae normal.     Pupils: Pupils are equal, round, and reactive to light.  Neck:     Vascular: No JVD.  Cardiovascular:     Rate and Rhythm: Normal rate and regular rhythm.     Heart sounds: Normal heart sounds. No murmur heard.  No gallop.   Pulmonary:     Effort: Pulmonary effort is normal. No respiratory distress.     Breath sounds: Normal breath sounds. No wheezing or rales.  Chest:     Chest wall: No tenderness.     Breasts:        Right: No mass or tenderness.        Left: No mass or tenderness.  Abdominal:     General: Bowel sounds are normal. There is no distension.     Palpations: Abdomen is soft. There is no mass.     Tenderness: There is no abdominal tenderness.  Genitourinary:    Comments: External genitalia, vagina, cervix, adnexa - normal Musculoskeletal:        General: No tenderness. Normal range of motion.     Cervical back: Normal range of motion.  Skin:    General: Skin is warm and dry.  Neurological:     Mental Status: She is alert and oriented to person, place, and time.     Deep Tendon Reflexes: Reflexes are normal and symmetric.     CMP Latest Ref Rng & Units 04/23/2019  Glucose 65 - 99 mg/dL 91  BUN 6 - 24 mg/dL 13  Creatinine 0.57 - 1.00 mg/dL 0.73  Sodium 134 - 144 mmol/L  142  Potassium 3.5 - 5.2 mmol/L 4.6  Chloride 96 - 106 mmol/L 106  CO2 20 - 29 mmol/L 21  Calcium 8.7 - 10.2 mg/dL 9.8  Total Protein 6.0 - 8.5 g/dL 6.6  Total Bilirubin 0.0 - 1.2 mg/dL <0.2  Alkaline Phos 39 - 117 IU/L 79  AST 0 - 40 IU/L 13  ALT 0 - 32 IU/L 8    Lipid Panel     Component Value Date/Time   CHOL 242 (H) 04/23/2019 0934   TRIG 62 04/23/2019 0934   HDL 94 04/23/2019 0934   CHOLHDL 2.6 04/23/2019 0934   LDLCALC 138 (H) 04/23/2019 0934    CBC    Component Value Date/Time   WBC 8.5 11/25/2006 1020   RBC 4.23 11/25/2006 1020   HGB 13.1 11/25/2006 1020   HCT 38.3 11/25/2006 1020   PLT 341 11/25/2006 1020   MCV 90.4 11/25/2006 1020   MCHC 34.3 11/25/2006 1020   RDW 12.8 11/25/2006 1020    No results found for: HGBA1C  Assessment & Plan:  1. Annual physical exam Counseled on 150 minutes of exercise per week, healthy eating (including decreased daily intake of saturated fats, cholesterol, added sugars, sodium), STI prevention, routine healthcare maintenance.  - CMP14+EGFR - Lipid panel  2. Screening for cervical cancer - Cytology - PAP([place])    No orders of the defined types were placed in this encounter.   Follow-up: Return in about 4 months (around 02/28/2020) for chronic disease management.       Charlott Rakes, MD, FAAFP. [facility] [place], [place]   10/29/2019, 6:12 PM

## 2019-10-29 NOTE — Patient Instructions (Signed)

## 2019-10-30 ENCOUNTER — Other Ambulatory Visit: Payer: Self-pay | Admitting: Family Medicine

## 2019-10-30 ENCOUNTER — Telehealth: Payer: Self-pay

## 2019-10-30 LAB — CMP14+EGFR
ALT: 11 IU/L (ref 0–32)
AST: 14 IU/L (ref 0–40)
Albumin/Globulin Ratio: 1.8 (ref 1.2–2.2)
Albumin: 4.4 g/dL (ref 3.8–4.9)
Alkaline Phosphatase: 92 IU/L (ref 48–121)
BUN/Creatinine Ratio: 17 (ref 9–23)
BUN: 13 mg/dL (ref 6–24)
Bilirubin Total: 0.2 mg/dL (ref 0.0–1.2)
CO2: 22 mmol/L (ref 20–29)
Calcium: 9.6 mg/dL (ref 8.7–10.2)
Chloride: 104 mmol/L (ref 96–106)
Creatinine, Ser: 0.77 mg/dL (ref 0.57–1.00)
GFR calc Af Amer: 103 mL/min/{1.73_m2} (ref >59)
GFR calc non Af Amer: 89 mL/min/{1.73_m2} (ref >59)
Globulin, Total: 2.5 g/dL (ref 1.5–4.5)
Glucose: 94 mg/dL (ref 65–99)
Potassium: 4 mmol/L (ref 3.5–5.2)
Sodium: 140 mmol/L (ref 134–144)
Total Protein: 6.9 g/dL (ref 6.0–8.5)

## 2019-10-30 LAB — LIPID PANEL
Chol/HDL Ratio: 2.6 ratio (ref 0.0–4.4)
Cholesterol, Total: 236 mg/dL — ABNORMAL HIGH (ref 100–199)
HDL: 92 mg/dL (ref >39)
LDL Chol Calc (NIH): 128 mg/dL — ABNORMAL HIGH (ref 0–99)
Triglycerides: 92 mg/dL (ref 0–149)
VLDL Cholesterol Cal: 16 mg/dL (ref 5–40)

## 2019-10-30 MED ORDER — ATORVASTATIN CALCIUM 20 MG PO TABS: 20.0000 mg | ORAL_TABLET | Freq: Every day | ORAL | 6 refills | Status: DC

## 2019-10-30 NOTE — Telephone Encounter (Signed)
Patient was called and a voicemail was left informing patient to return phone call for lab results.  A letter has been mailed out to patient to contact office. 

## 2019-10-30 NOTE — Telephone Encounter (Signed)
-----   Message from Hoy Register, MD sent at 10/30/2019 11:53 AM EDT ----- Labs reveal elevated cholesterol.  Please advise her to work on a low-cholesterol diet and exercise.  I have changed her simvastatin to atorvastatin follow-up better management of her cholesterol.

## 2019-11-02 LAB — CYTOLOGY - PAP
Comment: NEGATIVE
Diagnosis: NEGATIVE
High risk HPV: NEGATIVE

## 2019-11-04 ENCOUNTER — Telehealth: Payer: Self-pay

## 2019-11-04 NOTE — Telephone Encounter (Signed)
-----   Message from Hoy Register, MD sent at 11/03/2019  4:53 PM EDT ----- Pap smear is normal

## 2019-11-04 NOTE — Telephone Encounter (Signed)
Patient was called and a voicemail was left informing patient to return phone call for lab results. 

## 2019-11-11 ENCOUNTER — Ambulatory Visit: Payer: Self-pay | Attending: Family Medicine

## 2019-11-11 ENCOUNTER — Other Ambulatory Visit: Payer: Self-pay

## 2019-11-12 ENCOUNTER — Ambulatory Visit: Payer: Self-pay

## 2019-11-12 NOTE — Telephone Encounter (Signed)
Patient given results as noted below by Dr. Alvis Lemmings on 11/03/19, she verbalized understanding and says she was already given these results last week.

## 2019-11-12 NOTE — Telephone Encounter (Signed)
This encounter was created in error - please disregard.

## 2019-11-16 ENCOUNTER — Ambulatory Visit
Admission: RE | Admit: 2019-11-16 | Discharge: 2019-11-16 | Disposition: A | Discharge status: Home / Self Care | Payer: Self-pay | Source: Ambulatory Visit | Attending: Physician Assistant | Admitting: Physician Assistant

## 2019-11-16 ENCOUNTER — Other Ambulatory Visit: Payer: Self-pay

## 2019-11-16 VITALS — BP 128/81 | HR 77 | Temp 97.8°F | Resp 16

## 2019-11-16 DIAGNOSIS — M7661 Achilles tendinitis, right leg: Secondary | ICD-10-CM

## 2019-11-16 MED ORDER — DICLOFENAC SODIUM 1 % EX GEL: 2.0000 g | Freq: Four times a day (QID) | CUTANEOUS | 0 refills | Status: DC

## 2019-11-16 MED ORDER — MELOXICAM 7.5 MG PO TABS: 7.5000 mg | ORAL_TABLET | Freq: Every day | ORAL | 0 refills | Status: DC

## 2019-11-16 NOTE — ED Provider Notes (Signed)
[name]    CSN: [id number] Arrival date & time: 11/16/19  0946      History   Chief Complaint Chief Complaint  Patient presents with  . Foot Pain    HPI Brenda [name] is a 54 y.o. female.   54 year old female comes in for 1 week history of right heel pain. Denies injury/trauma. Has had chronic intermittent pain to the location, usually resolves on own. States can feel pulling sensation to the back of the heel. Denies numbness/tingling, swelling. Work requires long hours of standing, moving. Tried different shoes, advil without relief.      Past Medical History:  Diagnosis Date  . Abnormal Pap smear   . Carpal tunnel syndrome   . Hypercholesterolemia   . Hypertension     Patient Active Problem List   Diagnosis Date Noted  . Hemorrhoids 06/01/2019  . Constipation 06/01/2019  . Preventative health care 06/01/2019    Past Surgical History:  Procedure Laterality Date  . COLONOSCOPY N/A 08/12/2019   Large skin tag right buttock, 2 small hemorrhoid tags, 7 mm rectal polyp sessile, oozing s/p resection requiring epi and cautery, s/p clip placement. Benign colon polyp. Screening in 10 years.   Marland Kitchen LEEP      OB History    Gravida  3   Para  3   Term  3   Preterm      AB      Living  3     SAB      TAB      Ectopic      Multiple      Live Births               Home Medications    Prior to Admission medications   Medication Sig Start Date End Date Taking? Authorizing Provider  amLODipine (NORVASC) 5 MG tablet Take 1 tablet (5 mg total) by mouth daily. 06/04/19   Fulp, Cammie, MD  atorvastatin (LIPITOR) 20 MG tablet Take 1 tablet (20 mg total) by mouth daily. 10/30/19   Hoy Register, MD  diclofenac Sodium (VOLTAREN) 1 % GEL Apply 2 g topically 4 (four) times daily. 11/16/19   Cathie Hoops, Rylin Saez V, PA-C  meloxicam (MOBIC) 7.5 MG tablet Take 1 tablet (7.5 mg total) by mouth daily. 11/16/19   Belinda Fisher, PA-C    Family History Family  History  Problem Relation Age of Onset  . Hypertension Mother   . Hypertension Father   . Colon cancer Neg Hx     Social History Social History   Tobacco Use  . Smoking status: Never Smoker  . Smokeless tobacco: Never Used  Vaping Use  . Vaping Use: Never used  Substance Use Topics  . Alcohol use: No  . Drug use: No     Allergies   Patient has no known allergies.   Review of Systems Review of Systems  Reason unable to perform ROS: See HPI as above.     Physical Exam Triage Vital Signs ED Triage Vitals  Enc Vitals Group     BP 11/16/19 0955 128/81     Pulse Rate 11/16/19 0955 77     Resp 11/16/19 0955 16     Temp 11/16/19 0955 97.8 F (36.6 C)     Temp Source 11/16/19 0955 Oral     SpO2 11/16/19 0955 97 %     Weight --      Height --      Head Circumference --  Peak Flow --      Pain Score 11/16/19 0954 8     Pain Loc --      Pain Edu? --      Excl. in GC? --    No data found.  Updated Vital Signs BP 128/81 (BP Location: Left Arm)   Pulse 77   Temp 97.8 F (36.6 C) (Oral)   Resp 16   LMP 07/28/2016   SpO2 97%   Physical Exam Constitutional:      General: She is not in acute distress.    Appearance: Normal appearance. She is well-developed. She is not toxic-appearing or diaphoretic.  HENT:     Head: Normocephalic and atraumatic.  Eyes:     Conjunctiva/sclera: Conjunctivae normal.     Pupils: Pupils are equal, round, and reactive to light.  Pulmonary:     Effort: Pulmonary effort is normal. No respiratory distress.     Comments: Speaking in full sentences without difficulty Musculoskeletal:     Cervical back: Normal range of motion and neck supple.     Comments: No obvious swelling, erythema, warmth, contusion. Tenderness to palpation along achilles tendon. Tenderness to palpation along lateral heel. No tenderness to the MTPs, ankle. Full ROM of ankle, toes. Increased pain with dorsiflexion of ankle and toes. Strength 5/5. Sensation intact  and equal bilaterally. Pedal pulse 2+.   Skin:    General: Skin is warm and dry.  Neurological:     Mental Status: She is alert and oriented to person, place, and time.      UC Treatments / Results  Labs (all labs ordered are listed, but only abnormal results are displayed) Labs Reviewed - No data to display  EKG   Radiology No results found.  Procedures Procedures (including critical care time)  Medications Ordered in UC Medications - No data to display  Initial Impression / Assessment and Plan / UC Course  I have reviewed the triage vital signs and the nursing notes.  Pertinent labs & imaging results that were available during my care of the patient were reviewed by me and considered in my medical decision making (see chart for details).    Discussed history and exam consistent with achilles tendinitis. NSAIDs, ice compress, stretching exercises discussed. Return precautions given.   Final Clinical Impressions(s) / UC Diagnoses   Final diagnoses:  Achilles tendinitis of right lower extremity    ED Prescriptions    Medication Sig Dispense Auth. Provider   meloxicam (MOBIC) 7.5 MG tablet Take 1 tablet (7.5 mg total) by mouth daily. 15 tablet Deontez Klinke V, PA-C   diclofenac Sodium (VOLTAREN) 1 % GEL Apply 2 g topically 4 (four) times daily. 100 g Belinda Fisher, PA-C     PDMP not reviewed this encounter.   Belinda Fisher, PA-C 11/16/19 1113

## 2019-11-16 NOTE — Discharge Instructions (Signed)
Start Mobic. Do not take ibuprofen (motrin/advil)/ naproxen (aleve) while on mobic. Voltaren gel as needed. Ice compress, stretches, supportive shoes. Follow up with PCP/podiatrist if symptoms not improving.  Fleet feet

## 2019-11-16 NOTE — ED Triage Notes (Addendum)
Pt presents to [facility] for assessment of intermittent chronic heel pain, worsening x 1 week, which is causing a pulling sensation to the ankle.  Patient has tried new shoes and three different insoles without relief.  Patient denies injury.  Pt is on her feet at work a lot.

## 2019-12-01 ENCOUNTER — Ambulatory Visit: Payer: Self-pay | Admitting: Nurse Practitioner

## 2019-12-03 ENCOUNTER — Other Ambulatory Visit: Payer: Self-pay

## 2019-12-03 ENCOUNTER — Ambulatory Visit
Admission: RE | Admit: 2019-12-03 | Discharge: 2019-12-03 | Disposition: A | Discharge status: Home / Self Care | Payer: No Typology Code available for payment source | Source: Ambulatory Visit | Attending: Family Medicine | Admitting: Family Medicine

## 2019-12-03 DIAGNOSIS — Z1231 Encounter for screening mammogram for malignant neoplasm of breast: Secondary | ICD-10-CM

## 2019-12-10 ENCOUNTER — Telehealth: Payer: Self-pay

## 2019-12-10 NOTE — Telephone Encounter (Signed)
Patient name and DOB has been verified Patient was informed of lab results. Patient had no questions.  

## 2019-12-10 NOTE — Telephone Encounter (Signed)
-----   Message from Hoy Register, MD sent at 12/05/2019 10:45 PM EDT ----- Mammogram is negative for malignancy

## 2020-03-04 ENCOUNTER — Ambulatory Visit: Payer: Self-pay | Admitting: Family Medicine

## 2020-04-22 ENCOUNTER — Ambulatory Visit
Admission: RE | Admit: 2020-04-22 | Discharge: 2020-04-22 | Disposition: A | Discharge status: Home / Self Care | Payer: No Typology Code available for payment source | Source: Ambulatory Visit | Attending: Emergency Medicine | Admitting: Emergency Medicine

## 2020-04-22 ENCOUNTER — Other Ambulatory Visit: Payer: Self-pay

## 2020-04-22 VITALS — BP 139/76 | HR 75 | Temp 98.0°F | Resp 20

## 2020-04-22 DIAGNOSIS — M79622 Pain in left upper arm: Secondary | ICD-10-CM

## 2020-04-22 MED ORDER — NAPROXEN 500 MG PO TABS: 500.0000 mg | ORAL_TABLET | Freq: Two times a day (BID) | ORAL | 0 refills | Status: DC

## 2020-04-22 NOTE — Discharge Instructions (Signed)
Naprosyn twice daily  Warm compresses Follow up if not improving or worsening

## 2020-04-22 NOTE — ED Triage Notes (Signed)
Patient states she has had left underarm pain since Wednesday with no known cause. Pt states she had her booster on Monday in that arm. Pt is aox4 and ambulatory.

## 2020-04-23 NOTE — ED Provider Notes (Signed)
[name]    CSN: [id number] Arrival date & time: 04/22/20  1327      History   Chief Complaint No chief complaint on file. Left axillary pain  HPI Brenda [name] is a 54 y.o. female presenting today for evaluation of left axillary pain.  Reports that symptoms began approximately 2 days ago.  Denies any specific injury or trauma.  Does report that she had her booster in her left deltoid on Monday, but symptoms did not start until 2 days later.  She denies any changes in color of skin or any rashes or lumps in the area.  Denies breast pain.  Mammogram up-to-date.  Denies any shortness of breath or fevers.  HPI  Past Medical History:  Diagnosis Date  . Abnormal Pap smear   . Carpal tunnel syndrome   . Hypercholesterolemia   . Hypertension     Patient Active Problem List   Diagnosis Date Noted  . Hemorrhoids 06/01/2019  . Constipation 06/01/2019  . Preventative health care 06/01/2019    Past Surgical History:  Procedure Laterality Date  . COLONOSCOPY N/A 08/12/2019   Large skin tag right buttock, 2 small hemorrhoid tags, 7 mm rectal polyp sessile, oozing s/p resection requiring epi and cautery, s/p clip placement. Benign colon polyp. Screening in 10 years.   Marland Kitchen LEEP      OB History    Gravida  3   Para  3   Term  3   Preterm      AB      Living  3     SAB      IAB      Ectopic      Multiple      Live Births               Home Medications    Prior to Admission medications   Medication Sig Start Date End Date Taking? Authorizing Provider  amLODipine (NORVASC) 5 MG tablet Take 1 tablet (5 mg total) by mouth daily. 06/04/19  Yes Fulp, Cammie, MD  atorvastatin (LIPITOR) 20 MG tablet Take 1 tablet (20 mg total) by mouth daily. 10/30/19  Yes Hoy Register, MD  naproxen (NAPROSYN) 500 MG tablet Take 1 tablet (500 mg total) by mouth 2 (two) times daily. 04/22/20   Keylah Darwish, Junius Creamer, PA-C    Family History Family History   Problem Relation Age of Onset  . Hypertension Mother   . Hypertension Father   . Colon cancer Neg Hx     Social History Social History   Tobacco Use  . Smoking status: Never Smoker  . Smokeless tobacco: Never Used  Vaping Use  . Vaping Use: Never used  Substance Use Topics  . Alcohol use: No  . Drug use: No     Allergies   Patient has no known allergies.   Review of Systems Review of Systems  Constitutional: Negative for fatigue and fever.  HENT: Negative for mouth sores.   Eyes: Negative for visual disturbance.  Respiratory: Negative for shortness of breath.   Cardiovascular: Negative for chest pain.  Gastrointestinal: Negative for abdominal pain, nausea and vomiting.  Genitourinary: Negative for genital sores.  Musculoskeletal: Positive for arthralgias. Negative for joint swelling.  Skin: Negative for color change, rash and wound.  Neurological: Negative for dizziness, weakness, light-headedness and headaches.     Physical Exam Triage Vital Signs ED Triage Vitals  Enc Vitals Group     BP 04/22/20 1427 139/76  Pulse Rate 04/22/20 1427 75     Resp 04/22/20 1427 20     Temp 04/22/20 1427 98 F (36.7 C)     Temp Source 04/22/20 1427 Oral     SpO2 04/22/20 1427 97 %     Weight --      Height --      Head Circumference --      Peak Flow --      Pain Score 04/22/20 1430 3     Pain Loc --      Pain Edu? --      Excl. in GC? --    No data found.  Updated Vital Signs BP 139/76 (BP Location: Left Arm)   Pulse 75   Temp 98 F (36.7 C) (Oral)   Resp 20   LMP 07/28/2016   SpO2 97%   Visual Acuity Right Eye Distance:   Left Eye Distance:   Bilateral Distance:    Right Eye Near:   Left Eye Near:    Bilateral Near:     Physical Exam Vitals and nursing note reviewed.  Constitutional:      Appearance: She is well-developed and well-nourished.     Comments: No acute distress  HENT:     Head: Normocephalic and atraumatic.     Nose: Nose normal.   Eyes:     Conjunctiva/sclera: Conjunctivae normal.  Cardiovascular:     Rate and Rhythm: Normal rate.  Pulmonary:     Effort: Pulmonary effort is normal. No respiratory distress.  Abdominal:     General: There is no distension.  Musculoskeletal:        General: Normal range of motion.     Cervical back: Neck supple.     Comments: Full active range of motion of left shoulder  Skin:    General: Skin is warm and dry.     Comments: Left axilla with out any discoloration or induration or fluctuance, no lymphadenopathy  Small area of purple discoloration noted in axilla-patient reports this is birthmark  Neurological:     Mental Status: She is alert and oriented to person, place, and time.  Psychiatric:        Mood and Affect: Mood and affect normal.      UC Treatments / Results  Labs (all labs ordered are listed, but only abnormal results are displayed) Labs Reviewed - No data to display  EKG   Radiology No results found.  Procedures Procedures (including critical care time)  Medications Ordered in UC Medications - No data to display  Initial Impression / Assessment and Plan / UC Course  I have reviewed the triage vital signs and the nursing notes.  Pertinent labs & imaging results that were available during my care of the patient were reviewed by me and considered in my medical decision making (see chart for details).     No obvious cause of pain, no lymphadenopathy, no rashes or lesions in area, unclear cause of this at this time.  Recommending anti-inflammatories warm compresses and continued monitoring over the next week.  Discussed strict return precautions. Patient verbalized understanding and is agreeable with plan.  Final Clinical Impressions(s) / UC Diagnoses   Final diagnoses:  Left axillary pain     Discharge Instructions     Naprosyn twice daily  Warm compresses Follow up if not improving or worsening    ED Prescriptions    Medication Sig  Dispense Auth. Provider   naproxen (NAPROSYN) 500 MG tablet Take 1 tablet (  500 mg total) by mouth 2 (two) times daily. 30 tablet Marcia Lepera, [place] C, PA-C     PDMP not reviewed this encounter.   Lew Dawes, [place] 04/23/20 [phone]

## 2020-06-29 ENCOUNTER — Other Ambulatory Visit: Payer: Self-pay | Admitting: Family Medicine

## 2020-06-29 MED ORDER — AMLODIPINE BESYLATE 5 MG PO TABS: 5.0000 mg | ORAL_TABLET | Freq: Every day | ORAL | 0 refills | Status: DC

## 2020-06-29 MED ORDER — ATORVASTATIN CALCIUM 20 MG PO TABS: 20.0000 mg | ORAL_TABLET | Freq: Every day | ORAL | 0 refills | Status: DC

## 2020-06-29 NOTE — Telephone Encounter (Signed)
Medication Refill - Medication: amLODipine (NORVASC) 5 MG tablet  atorvastatin (LIPITOR) 20 MG tablet Pt scheduled the next available appt for 4.1.22    Has the patient contacted their pharmacy? No. (Agent: If no, request that the patient contact the pharmacy for the refill.) (Agent: If yes, when and what did the pharmacy advise?)  Preferred Pharmacy (with phone number or street name): [facility] [address] ([address] [place] - [address]  [address] [place]) [place] [address]  Phone:  [phone] Fax:  [phone]   Agent: Please be advised that RX refills may take up to 3 business days. We ask that you follow-up with your pharmacy.

## 2020-08-11 ENCOUNTER — Telehealth: Payer: Self-pay

## 2020-08-11 NOTE — Telephone Encounter (Signed)
Pt confirmed virtual appointment for tomorrow.

## 2020-08-12 ENCOUNTER — Other Ambulatory Visit: Payer: Self-pay

## 2020-08-12 ENCOUNTER — Encounter: Payer: Self-pay | Admitting: Nurse Practitioner

## 2020-08-12 ENCOUNTER — Ambulatory Visit: Payer: Self-pay | Attending: Nurse Practitioner | Admitting: Nurse Practitioner

## 2020-08-12 DIAGNOSIS — E785 Hyperlipidemia, unspecified: Secondary | ICD-10-CM

## 2020-08-12 DIAGNOSIS — Z7689 Persons encountering health services in other specified circumstances: Secondary | ICD-10-CM

## 2020-08-12 DIAGNOSIS — I10 Essential (primary) hypertension: Secondary | ICD-10-CM

## 2020-08-12 DIAGNOSIS — M7661 Achilles tendinitis, right leg: Secondary | ICD-10-CM

## 2020-08-12 MED ORDER — AMLODIPINE BESYLATE 5 MG PO TABS: 5.0000 mg | ORAL_TABLET | Freq: Every day | ORAL | 0 refills | Status: DC

## 2020-08-12 MED ORDER — NAPROXEN 500 MG PO TABS: 500.0000 mg | ORAL_TABLET | Freq: Two times a day (BID) | ORAL | 1 refills | Status: DC

## 2020-08-12 MED ORDER — ATORVASTATIN CALCIUM 20 MG PO TABS: 20.0000 mg | ORAL_TABLET | Freq: Every day | ORAL | 3 refills | Status: DC

## 2020-08-12 NOTE — Progress Notes (Signed)
Virtual Visit via Telephone Note Due to national recommendations of social distancing due to COVID 19, telehealth visit is felt to be most appropriate for this patient at this time.  I discussed the limitations, risks, security and privacy concerns of performing an evaluation and management service by telephone and the availability of in person appointments. I also discussed with the patient that there may be a patient responsible charge related to this service. The patient expressed understanding and agreed to proceed.    I connected with Brenda [name] on 08/12/20  at   3:30 PM EDT  EDT by telephone and verified that I am speaking with the correct person using two identifiers.   Consent I discussed the limitations, risks, security and privacy concerns of performing an evaluation and management service by telephone and the availability of in person appointments. I also discussed with the patient that there may be a patient responsible charge related to this service. The patient expressed understanding and agreed to proceed.   Location of Patient: Private  Residence   Location of Provider: [facility]    Persons participating in Telemedicine visit: Bertram Denver FNP-BC YY Bien CMA Helmut Muster [name]    History of Present Illness: Telemedicine visit for: Establish care  Joint Pain Onset of pain a few months ago. Located in the left shoulder joint. Pain lasts a few seconds and feels like she has been punched in her arm.  Pain worse with reaching or bending the left arm behind her back  She is right handed. She usually sleeps on her right side at night. Denies any injury or trauma.  She also endorses intermittent right posterior ankle pain similar to previous pain diagnosed as achilles tendinitis. Pain can sometimes last as long as 2 weeks. Minimal relief with NSAIDs. Will need to be referred to podiatry. Patient has been advised to apply for  financial assistance and schedule to see our financial counselor.   Essential Hypertension Well controlled. Yesterday SBP 125. Today "121/70 something" Taking amlodipine 5 mg daily as prescribed.  Denies chest pain, shortness of breath, palpitations, lightheadedness, dizziness, headaches or BLE edema.  BP Readings from Last 3 Encounters:  04/22/20 139/76  11/16/19 128/81  10/29/19 119/73    Dyslipidemia LDL not at goal with taking atorvastatin 20 mg  Daily.  Lab Results  Component Value Date   CHOL 236 (H) 10/29/2019   CHOL 242 (H) 04/23/2019   Lab Results  Component Value Date   HDL 92 10/29/2019   HDL 94 04/23/2019   Lab Results  Component Value Date   LDLCALC 128 (H) 10/29/2019   LDLCALC 138 (H) 04/23/2019   Lab Results  Component Value Date   TRIG 92 10/29/2019   TRIG 62 04/23/2019   Lab Results  Component Value Date   CHOLHDL 2.6 10/29/2019   CHOLHDL 2.6 04/23/2019      Past Medical History:  Diagnosis Date  . Abnormal Pap smear   . Carpal tunnel syndrome   . Hypercholesterolemia   . Hypertension     Past Surgical History:  Procedure Laterality Date  . COLONOSCOPY N/A 08/12/2019   Large skin tag right buttock, 2 small hemorrhoid tags, 7 mm rectal polyp sessile, oozing s/p resection requiring epi and cautery, s/p clip placement. Benign colon polyp. Screening in 10 years.   Marland Kitchen LEEP      Family History  Problem Relation Age of Onset  . Hypertension Mother   . Hypertension Father   . Colon cancer  Neg Hx     Social History   Socioeconomic History  . Marital status: Single    Spouse name: Not on file  . Number of children: 3  . Years of education: Not on file  . Highest education level: Not on file  Occupational History  . Not on file  Tobacco Use  . Smoking status: Never Smoker  . Smokeless tobacco: Never Used  Vaping Use  . Vaping Use: Never used  Substance and Sexual Activity  . Alcohol use: No  . Drug use: No  . Sexual activity: Not  Currently    Birth control/protection: None  Other Topics Concern  . Not on file  Social History Narrative   Employment: housekeeping; from [place].   Social Determinants of Health   Financial Resource Strain: Not on file  Food Insecurity: Not on file  Transportation Needs: Not on file  Physical Activity: Not on file  Stress: Not on file  Social Connections: Not on file     Observations/Objective: Awake, alert and oriented x 3   Review of Systems  Constitutional: Negative for fever, malaise/fatigue and weight loss.  HENT: Negative.  Negative for nosebleeds.   Eyes: Negative.  Negative for blurred vision, double vision and photophobia.  Respiratory: Negative.  Negative for cough and shortness of breath.   Cardiovascular: Negative.  Negative for chest pain, palpitations and leg swelling.  Gastrointestinal: Negative.  Negative for heartburn, nausea and vomiting.  Musculoskeletal: Positive for joint pain. Negative for myalgias.  Neurological: Negative.  Negative for dizziness, focal weakness, seizures and headaches.  Psychiatric/Behavioral: Negative.  Negative for suicidal ideas.    Assessment and Plan: Brenda [name] was seen today for follow-up.  Diagnoses and all orders for this visit:  Encounter to establish care  Essential hypertension -     amLODipine (NORVASC) 5 MG tablet; Take 1 tablet (5 mg total) by mouth daily. Continue all antihypertensives as prescribed.  Remember to bring in your blood pressure log with you for your follow up appointment.  DASH/Mediterranean Diets are healthier choices for HTN.   Dyslipidemia, goal LDL below 100 -     atorvastatin (LIPITOR) 20 MG tablet; Take 1 tablet (20 mg total) by mouth daily. INSTRUCTIONS: Work on a low fat, heart healthy diet and participate in regular aerobic exercise program by working out at least 150 minutes per week; 5 days a week-30 minutes per day. Avoid red meat/beef/steak,  fried foods. junk foods, sodas, sugary drinks,  unhealthy snacking, alcohol and smoking.  Drink at least 80 oz of water per day and monitor your carbohydrate intake daily.    Right Achilles tendinitis -     naproxen (NAPROSYN) 500 MG tablet; Take 1 tablet (500 mg total) by mouth 2 (two) times daily.     Follow Up Instructions Return in about 3 months (around 11/11/2020).     I discussed the assessment and treatment plan with the patient. The patient was provided an opportunity to ask questions and all were answered. The patient agreed with the plan and demonstrated an understanding of the instructions.   The patient was advised to call back or seek an in-person evaluation if the symptoms worsen or if the condition fails to improve as anticipated.  I provided 13 minutes of non-face-to-face time during this encounter including median intraservice time, reviewing previous notes, labs, imaging, medications and explaining diagnosis and management.  Claiborne Rigg, FNP-BC

## 2020-08-12 NOTE — Progress Notes (Signed)
Follow-up for medications. Pt also states having pain occassionally in left upper arm when doing activity.

## 2020-08-12 NOTE — Progress Notes (Deleted)
   Assessment & Plan:  There are no diagnoses linked to this encounter.  Patient has been counseled on age-appropriate routine health concerns for screening and prevention. These are reviewed and up-to-date. Referrals have been placed accordingly. Immunizations are up-to-date or declined.    Subjective:   Chief Complaint  Patient presents with  . Follow-up   HPI Brenda [name] 55 y.o. female presents to office today to establish care.  Patient has been counseled on age-appropriate routine health concerns for screening and prevention. These are reviewed and up-to-date. Referrals have been placed accordingly. Immunizations are up-to-date or declined.     Arm Pain Onset of pain a few months ago. Located in the left shoulder joint. Pain lasts a few seconds and feels like she has been punched in her arm.  Pain worse with reaching or bending the left arm behind her back  She is right handed. She usually sleeps on her right side at night. Denies any injury or trauma.    Essential Hypertension Well controlled. Yesterday SBP 125. Today "121/70 something" Taking amlodipine 5 mg daily as prescribed.  Denies chest pain, shortness of breath, palpitations, lightheadedness, dizziness, headaches or BLE edema.  BP Readings from Last 3 Encounters:  04/22/20 139/76  11/16/19 128/81  10/29/19 119/73    Lasts up to 2 weeks.    ROS  Past Medical History:  Diagnosis Date  . Abnormal Pap smear   . Carpal tunnel syndrome   . Hypercholesterolemia   . Hypertension     Past Surgical History:  Procedure Laterality Date  . COLONOSCOPY N/A 08/12/2019   Large skin tag right buttock, 2 small hemorrhoid tags, 7 mm rectal polyp sessile, oozing s/p resection requiring epi and cautery, s/p clip placement. Benign colon polyp. Screening in 10 years.   Marland Kitchen LEEP      Family History  Problem Relation Age of Onset  . Hypertension Mother   . Hypertension Father   . Colon cancer Neg Hx     Social  History Reviewed with no changes to be made today.   Outpatient Medications Prior to Visit  Medication Sig Dispense Refill  . amLODipine (NORVASC) 5 MG tablet Take 1 tablet (5 mg total) by mouth daily. 90 tablet 0  . atorvastatin (LIPITOR) 20 MG tablet Take 1 tablet (20 mg total) by mouth daily. 90 tablet 0  . naproxen (NAPROSYN) 500 MG tablet Take 1 tablet (500 mg total) by mouth 2 (two) times daily. 30 tablet 0   No facility-administered medications prior to visit.    No Known Allergies     Objective:    LMP 07/28/2016  Wt Readings from Last 3 Encounters:  10/29/19 126 lb 1 oz (57.2 kg)  10/20/19 125 lb (56.7 kg)  08/12/19 130 lb (59 kg)    Physical Exam       Patient has been counseled extensively about nutrition and exercise as well as the importance of adherence with medications and regular follow-up. The patient was given clear instructions to go to ER or return to medical center if symptoms don't improve, worsen or new problems develop. The patient verbalized understanding.   Follow-up: No follow-ups on file.   Claiborne Rigg, FNP-BC [facility] [place], [place] [phone]   08/12/2020, 3:38 PM

## 2020-10-07 ENCOUNTER — Ambulatory Visit: Payer: Self-pay | Admitting: *Deleted

## 2020-10-07 NOTE — Telephone Encounter (Signed)
  Reason for Disposition . [1] SEVERE pain (e.g., excruciating, unable to do any normal activities) AND [2] not improved 2 hours after pain medicine  Answer Assessment - Initial Assessment Questions 1. LOCATION: "Which tooth is hurting?"  (e.g., right-side/left-side, upper/lower, front/back)     R side lower molar in back 2. ONSET: "When did the toothache start?"  (e.g., hours, days)     4 days ago 3. SEVERITY: "How bad is the toothache?"  (Scale 1-10; mild, moderate or severe)   - MILD (1-3): doesn't interfere with chewing    - MODERATE (4-7): interferes with chewing, interferes with normal activities, awakens from sleep     - SEVERE (8-10): unable to eat, unable to do any normal activities, excruciating pain        Severe- headache, ear pain 4. SWELLING: "Is there any visible swelling of your face?"     Just the gum around the tooth 5. OTHER SYMPTOMS: "Do you have any other symptoms?" (e.g., fever)     Headache, ear pain 6. PREGNANCY: "Is there any chance you are pregnant?" "When was your last menstrual period?"     n/a  Protocols used: TOOTHACHE-A-AH

## 2020-10-07 NOTE — Telephone Encounter (Signed)
Patient believes she has an infected tooth that is causing her discomfort for roughly 4 days, patient is unable to get in with her Dentist until 10/11/2020 and was advised to contact her PCP office, patient seeking clinical advice

## 2020-10-08 ENCOUNTER — Other Ambulatory Visit: Payer: Self-pay

## 2020-10-08 ENCOUNTER — Ambulatory Visit
Admission: RE | Admit: 2020-10-08 | Discharge: 2020-10-08 | Disposition: A | Discharge status: Home / Self Care | Payer: No Typology Code available for payment source | Source: Ambulatory Visit | Attending: Emergency Medicine | Admitting: Emergency Medicine

## 2020-10-08 VITALS — BP 128/76 | HR 89 | Temp 98.3°F | Resp 18

## 2020-10-08 DIAGNOSIS — K047 Periapical abscess without sinus: Secondary | ICD-10-CM

## 2020-10-08 MED ORDER — IBUPROFEN 600 MG PO TABS: 600.0000 mg | ORAL_TABLET | Freq: Four times a day (QID) | ORAL | 0 refills | Status: DC | PRN

## 2020-10-08 MED ORDER — CHLORHEXIDINE GLUCONATE 0.12 % MT SOLN: 15.0000 mL | Freq: Two times a day (BID) | OROMUCOSAL | 0 refills | Status: DC

## 2020-10-08 MED ORDER — AMOXICILLIN 500 MG PO CAPS: 500.0000 mg | ORAL_CAPSULE | Freq: Three times a day (TID) | ORAL | 0 refills | Status: AC

## 2020-10-08 NOTE — Discharge Instructions (Signed)
Begin amoxicillin every 8 hours for 1 week Chlorhexidine mouth rinse twice daily Ibuprofen and Tylenol for pain Warm compresses Follow-up with dentistry as planned Follow-up if any symptoms not improving or worsening

## 2020-10-08 NOTE — ED Provider Notes (Signed)
[name]    CSN: [id number] Arrival date & time: 10/08/20  0949      History   Chief Complaint Chief Complaint  Patient presents with  . Appointment    1000  . Dental Pain    HPI Brenda [name] is a 55 y.o. female presenting today for evaluation of dental pain.  Reports right lower dental pain for the past 5 days.  Reports pain and swelling to posterior molar.  Has plans to follow-up with dentistry this upcoming week.  Denies fevers or neck stiffness. HPI  Past Medical History:  Diagnosis Date  . Abnormal Pap smear   . Carpal tunnel syndrome   . Hypercholesterolemia   . Hypertension     Patient Active Problem List   Diagnosis Date Noted  . Hemorrhoids 06/01/2019  . Constipation 06/01/2019  . Preventative health care 06/01/2019    Past Surgical History:  Procedure Laterality Date  . COLONOSCOPY N/A 08/12/2019   Large skin tag right buttock, 2 small hemorrhoid tags, 7 mm rectal polyp sessile, oozing s/p resection requiring epi and cautery, s/p clip placement. Benign colon polyp. Screening in 10 years.   Marland Kitchen LEEP      OB History    Gravida  3   Para  3   Term  3   Preterm      AB      Living  3     SAB      IAB      Ectopic      Multiple      Live Births               Home Medications    Prior to Admission medications   Medication Sig Start Date End Date Taking? Authorizing Provider  amoxicillin (AMOXIL) 500 MG capsule Take 1 capsule (500 mg total) by mouth 3 (three) times daily for 7 days. 10/08/20 10/15/20 Yes Eleanna Theilen C, PA-C  chlorhexidine (PERIDEX) 0.12 % solution Use as directed 15 mLs in the mouth or throat 2 (two) times daily. Swish for 30 seconds, spit out 10/08/20  Yes Teshaun Olarte C, PA-C  ibuprofen (ADVIL) 600 MG tablet Take 1 tablet (600 mg total) by mouth every 6 (six) hours as needed. 10/08/20  Yes Latravia Southgate C, PA-C  amLODipine (NORVASC) 5 MG tablet Take 1 tablet (5 mg total) by mouth daily.  08/12/20   Claiborne Rigg, NP  atorvastatin (LIPITOR) 20 MG tablet Take 1 tablet (20 mg total) by mouth daily. 08/12/20   Claiborne Rigg, NP  naproxen (NAPROSYN) 500 MG tablet Take 1 tablet (500 mg total) by mouth 2 (two) times daily. 08/12/20   Claiborne Rigg, NP    Family History Family History  Problem Relation Age of Onset  . Hypertension Mother   . Hypertension Father   . Colon cancer Neg Hx     Social History Social History   Tobacco Use  . Smoking status: Never Smoker  . Smokeless tobacco: Never Used  Vaping Use  . Vaping Use: Never used  Substance Use Topics  . Alcohol use: No  . Drug use: No     Allergies   Patient has no known allergies.   Review of Systems Review of Systems  Constitutional: Negative for activity change, appetite change, chills, fatigue and fever.  HENT: Positive for dental problem. Negative for congestion, ear pain, rhinorrhea, sinus pressure, sore throat and trouble swallowing.   Eyes: Negative for discharge and redness.  Respiratory:  Negative for cough, chest tightness and shortness of breath.   Cardiovascular: Negative for chest pain.  Gastrointestinal: Negative for abdominal pain, diarrhea, nausea and vomiting.  Musculoskeletal: Negative for myalgias.  Skin: Negative for rash.  Neurological: Negative for dizziness, light-headedness and headaches.     Physical Exam Triage Vital Signs ED Triage Vitals  Enc Vitals Group     BP 10/08/20 1010 128/76     Pulse Rate 10/08/20 1010 89     Resp 10/08/20 1010 18     Temp 10/08/20 1010 98.3 F (36.8 C)     Temp Source 10/08/20 1010 Oral     SpO2 10/08/20 1010 97 %     Weight --      Height --      Head Circumference --      Peak Flow --      Pain Score 10/08/20 1011 6     Pain Loc --      Pain Edu? --      Excl. in GC? --    No data found.  Updated Vital Signs BP 128/76 (BP Location: Left Arm)   Pulse 89   Temp 98.3 F (36.8 C) (Oral)   Resp 18   LMP 07/28/2016   SpO2 97%    Visual Acuity Right Eye Distance:   Left Eye Distance:   Bilateral Distance:    Right Eye Near:   Left Eye Near:    Bilateral Near:     Physical Exam Vitals and nursing note reviewed.  Constitutional:      Appearance: She is well-developed.     Comments: No acute distress  HENT:     Head: Normocephalic and atraumatic.     Nose: Nose normal.     Mouth/Throat:     Comments: Gingival erythema and swelling noted in between posterior molars on right lower jaw, associated tenderness, no soft palate swelling, uvula midline Eyes:     Conjunctiva/sclera: Conjunctivae normal.  Neck:     Comments: No overlying neck swelling or erythema Cardiovascular:     Rate and Rhythm: Normal rate.  Pulmonary:     Effort: Pulmonary effort is normal. No respiratory distress.  Abdominal:     General: There is no distension.  Musculoskeletal:        General: Normal range of motion.     Cervical back: Neck supple.  Skin:    General: Skin is warm and dry.  Neurological:     Mental Status: She is alert and oriented to person, place, and time.      UC Treatments / Results  Labs (all labs ordered are listed, but only abnormal results are displayed) Labs Reviewed - No data to display  EKG   Radiology No results found.  Procedures Procedures (including critical care time)  Medications Ordered in UC Medications - No data to display  Initial Impression / Assessment and Plan / UC Course  I have reviewed the triage vital signs and the nursing notes.  Pertinent labs & imaging results that were available during my care of the patient were reviewed by me and considered in my medical decision making (see chart for details).     Treating for dental infection with amoxicillin, chlorhexidine and anti-inflammatories for pain and warm compresses.  Follow-up with dentistry as planned.  No sign of deep space abscess/infection at this time.  Discussed strict return precautions. Patient verbalized  understanding and is agreeable with plan.  Final Clinical Impressions(s) / UC Diagnoses   Final  diagnoses:  Dental infection     Discharge Instructions     Begin amoxicillin every 8 hours for 1 week Chlorhexidine mouth rinse twice daily Ibuprofen and Tylenol for pain Warm compresses Follow-up with dentistry as planned Follow-up if any symptoms not improving or worsening    ED Prescriptions    Medication Sig Dispense Auth. Provider   amoxicillin (AMOXIL) 500 MG capsule Take 1 capsule (500 mg total) by mouth 3 (three) times daily for 7 days. 21 capsule Danai Gotto C, PA-C   chlorhexidine (PERIDEX) 0.12 % solution Use as directed 15 mLs in the mouth or throat 2 (two) times daily. Swish for 30 seconds, spit out 120 mL Randen Kauth C, PA-C   ibuprofen (ADVIL) 600 MG tablet Take 1 tablet (600 mg total) by mouth every 6 (six) hours as needed. 30 tablet Mira Balon, [place] C, PA-C     PDMP not reviewed this encounter.   Lew Dawes, PA-C 10/08/20 1034

## 2020-10-08 NOTE — ED Triage Notes (Signed)
Pt here for right lower dental pain x 5 days

## 2020-10-09 NOTE — Telephone Encounter (Signed)
NEEDS URGENT CARE.

## 2020-10-12 ENCOUNTER — Ambulatory Visit: Payer: No Typology Code available for payment source | Attending: Nurse Practitioner

## 2020-10-12 ENCOUNTER — Encounter (INDEPENDENT_AMBULATORY_CARE_PROVIDER_SITE_OTHER): Payer: Self-pay

## 2020-10-12 ENCOUNTER — Other Ambulatory Visit: Payer: Self-pay

## 2020-11-09 ENCOUNTER — Encounter: Payer: Self-pay | Admitting: Physician Assistant

## 2020-11-09 ENCOUNTER — Other Ambulatory Visit: Payer: Self-pay

## 2020-11-09 ENCOUNTER — Ambulatory Visit: Payer: Self-pay | Attending: Physician Assistant | Admitting: Physician Assistant

## 2020-11-09 VITALS — BP 123/67 | HR 66 | Ht 60.0 in | Wt 132.6 lb

## 2020-11-09 DIAGNOSIS — I10 Essential (primary) hypertension: Secondary | ICD-10-CM

## 2020-11-09 DIAGNOSIS — E785 Hyperlipidemia, unspecified: Secondary | ICD-10-CM

## 2020-11-09 DIAGNOSIS — M778 Other enthesopathies, not elsewhere classified: Secondary | ICD-10-CM

## 2020-11-09 DIAGNOSIS — Z131 Encounter for screening for diabetes mellitus: Secondary | ICD-10-CM

## 2020-11-09 MED ORDER — AMLODIPINE BESYLATE 5 MG PO TABS: 5.0000 mg | ORAL_TABLET | Freq: Every day | ORAL | 1 refills | Status: DC

## 2020-11-09 MED ORDER — NAPROXEN 500 MG PO TABS: 500.0000 mg | ORAL_TABLET | Freq: Two times a day (BID) | ORAL | 1 refills | Status: DC

## 2020-11-09 MED ORDER — ATORVASTATIN CALCIUM 20 MG PO TABS: 20.0000 mg | ORAL_TABLET | Freq: Every day | ORAL | 3 refills | Status: DC

## 2020-11-09 NOTE — Progress Notes (Signed)
Brenda [name], is a 55 y.o. female  UQJ:[id number]  LSL:[id number]  DOB - 08-Aug-1965  Subjective:  Chief Complaint and HPI: Brenda [name] is a 55 y.o. female here today for med RF, blood work and R thumb pain.  She cleans houses for a living and already has R carpal tunnel syndrome and sleeps in a splint.  For about 4-5 days now she has had R thumb pain.  NKI.  ROS:   Constitutional:  No f/c, No night sweats, No unexplained weight loss. EENT:  No vision changes, No blurry vision, No hearing changes. No mouth, throat, or ear problems.  Respiratory: No cough, No SOB Cardiac: No CP, no palpitations GI:  No abd pain, No N/V/D. GU: No Urinary s/sx Musculoskeletal: No joint pain Neuro: No headache, no dizziness, no motor weakness.  Skin: No rash Endocrine:  No polydipsia. No polyuria.  Psych: Denies SI/HI  No problems updated.  ALLERGIES: No Known Allergies  PAST MEDICAL HISTORY: Past Medical History:  Diagnosis Date   Abnormal Pap smear    Carpal tunnel syndrome    Hypercholesterolemia    Hypertension     MEDICATIONS AT HOME: Prior to Admission medications   Medication Sig Start Date End Date Taking? Authorizing Provider  chlorhexidine (PERIDEX) 0.12 % solution Use as directed 15 mLs in the mouth or throat 2 (two) times daily. Swish for 30 seconds, spit out 10/08/20  Yes Wieters, Hallie C, PA-C  ibuprofen (ADVIL) 600 MG tablet Take 1 tablet (600 mg total) by mouth every 6 (six) hours as needed. 10/08/20  Yes Wieters, Hallie C, PA-C  amLODipine (NORVASC) 5 MG tablet Take 1 tablet (5 mg total) by mouth daily. 11/09/20   Anders Simmonds, PA-C  atorvastatin (LIPITOR) 20 MG tablet Take 1 tablet (20 mg total) by mouth daily. 11/09/20   Anders Simmonds, PA-C  naproxen (NAPROSYN) 500 MG tablet Take 1 tablet (500 mg total) by mouth 2 (two) times daily. 11/09/20   Anders Simmonds, PA-C     Objective:  EXAM:   Vitals:   11/09/20 1112  BP: 123/67  Pulse: 66   SpO2: 99%  Weight: 132 lb 9.6 oz (60.1 kg)  Height: 5' (1.524 m)    General appearance : A&OX3. NAD. Non-toxic-appearing HEENT: Atraumatic and Normocephalic.  PERRLA. EOM intact.  Chest/Lungs:  Breathing-non-labored, Good air entry bilaterally, breath sounds normal without rales, rhonchi, or wheezing  CVS: S1 S2 regular, no murmurs, gallops, rubs  R thumb with skin warm, dry and intact.  No erythema.  The is minimal swelling along the flexor tendon and +finkelsteins.  ROM and S in tact Extremities: Bilateral Lower Ext shows no edema, both legs are warm to touch with = pulse throughout Neurology:  CN II-XII grossly intact, Non focal.   Psych:  TP linear. J/I WNL. Normal speech. Appropriate eye contact and affect.  Skin:  No Rash  Data Review No results found for: HGBA1C   Assessment & Plan   1. Essential hypertension controlled - amLODipine (NORVASC) 5 MG tablet; Take 1 tablet (5 mg total) by mouth daily.  Dispense: 90 tablet; Refill: 1 - Comprehensive metabolic panel - Lipid panel - CBC with Differential/Platelet  2. Dyslipidemia, goal LDL below 100 - atorvastatin (LIPITOR) 20 MG tablet; Take 1 tablet (20 mg total) by mouth daily.  Dispense: 90 tablet; Refill: 3 - Comprehensive metabolic panel - Lipid panel - CBC with Differential/Platelet  3. Thumb tendonitis Thumb spica splint shown online-obtain and use as much as  possible and definitely during sleep - naproxen (NAPROSYN) 500 MG tablet; Take 1 tablet (500 mg total) by mouth 2 (two) times daily.  Dispense: 30 tablet; Refill: 1  4. Screening for diabetes mellitus I have had a lengthy discussion and provided education about insulin resistance and the intake of too much sugar/refined carbohydrates.  I have advised the patient to work at a goal of eliminating sugary drinks, candy, desserts, sweets, refined sugars, processed foods, and white carbohydrates.  The patient expresses understanding.   - Hemoglobin  A1c     Patient have been counseled extensively about nutrition and exercise  Return in about 6 months (around 05/11/2021) for PCP; chronic conditions.  The patient was given clear instructions to go to ER or return to medical center if symptoms don't improve, worsen or new problems develop. The patient verbalized understanding. The patient was told to call to get lab results if they haven't heard anything in the next week.     Georgian Co, PA-C [facility] [place], [place] [phone]   11/09/2020, 12:00 PM Patient ID: Brenda [name], female   DOB: [birth year], 55 y.o.   MRN: [id number]

## 2020-11-09 NOTE — Patient Instructions (Signed)
Thumb spica splint for R hand

## 2020-11-10 LAB — COMPREHENSIVE METABOLIC PANEL
ALT: 10 IU/L (ref 0–32)
AST: 11 IU/L (ref 0–40)
Albumin/Globulin Ratio: 1.7 (ref 1.2–2.2)
Albumin: 4.5 g/dL (ref 3.8–4.9)
Alkaline Phosphatase: 107 IU/L (ref 44–121)
BUN/Creatinine Ratio: 18 (ref 9–23)
BUN: 12 mg/dL (ref 6–24)
Bilirubin Total: 0.3 mg/dL (ref 0.0–1.2)
CO2: 23 mmol/L (ref 20–29)
Calcium: 9.5 mg/dL (ref 8.7–10.2)
Chloride: 107 mmol/L — ABNORMAL HIGH (ref 96–106)
Creatinine, Ser: 0.68 mg/dL (ref 0.57–1.00)
Globulin, Total: 2.6 g/dL (ref 1.5–4.5)
Glucose: 85 mg/dL (ref 65–99)
Potassium: 4.5 mmol/L (ref 3.5–5.2)
Sodium: 144 mmol/L (ref 134–144)
Total Protein: 7.1 g/dL (ref 6.0–8.5)
eGFR: 104 mL/min/{1.73_m2} (ref >59)

## 2020-11-10 LAB — CBC WITH DIFFERENTIAL/PLATELET
Basophils Absolute: 0.1 10*3/uL (ref 0.0–0.2)
Basos: 1 %
EOS (ABSOLUTE): 0.1 10*3/uL (ref 0.0–0.4)
Eos: 2 %
Hematocrit: 38.2 % (ref 34.0–46.6)
Hemoglobin: 12.5 g/dL (ref 11.1–15.9)
Immature Grans (Abs): 0 10*3/uL (ref 0.0–0.1)
Immature Granulocytes: 0 %
Lymphocytes Absolute: 2.3 10*3/uL (ref 0.7–3.1)
Lymphs: 37 %
MCH: 29.3 pg (ref 26.6–33.0)
MCHC: 32.7 g/dL (ref 31.5–35.7)
MCV: 90 fL (ref 79–97)
Monocytes Absolute: 0.6 10*3/uL (ref 0.1–0.9)
Monocytes: 10 %
Neutrophils Absolute: 3.1 10*3/uL (ref 1.4–7.0)
Neutrophils: 50 %
Platelets: 300 10*3/uL (ref 150–450)
RBC: 4.26 x10E6/uL (ref 3.77–5.28)
RDW: 12.8 % (ref 11.7–15.4)
WBC: 6.2 10*3/uL (ref 3.4–10.8)

## 2020-11-10 LAB — LIPID PANEL
Chol/HDL Ratio: 2.3 ratio (ref 0.0–4.4)
Cholesterol, Total: 212 mg/dL — ABNORMAL HIGH (ref 100–199)
HDL: 91 mg/dL (ref >39)
LDL Chol Calc (NIH): 99 mg/dL (ref 0–99)
Triglycerides: 130 mg/dL (ref 0–149)
VLDL Cholesterol Cal: 22 mg/dL (ref 5–40)

## 2020-11-10 LAB — HEMOGLOBIN A1C
Est. average glucose Bld gHb Est-mCnc: 114 mg/dL
Hgb A1c MFr Bld: 5.6 % (ref 4.8–5.6)

## 2020-12-10 ENCOUNTER — Other Ambulatory Visit: Payer: Self-pay

## 2020-12-10 ENCOUNTER — Ambulatory Visit
Admission: RE | Admit: 2020-12-10 | Discharge: 2020-12-10 | Disposition: A | Discharge status: Home / Self Care | Payer: No Typology Code available for payment source | Source: Ambulatory Visit | Attending: Nurse Practitioner | Admitting: Nurse Practitioner

## 2020-12-10 VITALS — BP 137/74 | HR 72 | Temp 98.0°F | Resp 16

## 2020-12-10 DIAGNOSIS — H05811 Cyst of right orbit: Secondary | ICD-10-CM

## 2020-12-10 NOTE — Discharge Instructions (Signed)
Contact the ophthalmologist listed on your discharge paperwork first thing Monday morning for further work-up and evaluation of the cystic lesion on your eye.

## 2020-12-10 NOTE — ED Provider Notes (Signed)
[name]    CSN: [id number] Arrival date & time: 12/10/20  1251      History   Chief Complaint Chief Complaint  Patient presents with   Eye Problem    HPI Crystal Scarberry is a 55 y.o. female.   HPI Patient presents today with a concern for acute cystic lesion appearing on the medial aspect of her sclerae.  She reports that she had not been feeling well over the past few days and subsequently noticed this fluid-filled lesion appearing on her sclera.  She denies any difficulty with her vision or recent use of contact lenses or recent eye injury.  When she blinks she feels a sensation of the cyst on the lesion but otherwise it is not impairing vision.  She denies any history of chronic dry eyes.  She has not instilled any medication into the eye. Past Medical History:  Diagnosis Date   Abnormal Pap smear    Carpal tunnel syndrome    Hypercholesterolemia    Hypertension     Patient Active Problem List   Diagnosis Date Noted   Hemorrhoids 06/01/2019   Constipation 06/01/2019   Preventative health care 06/01/2019    Past Surgical History:  Procedure Laterality Date   COLONOSCOPY N/A 08/12/2019   Large skin tag right buttock, 2 small hemorrhoid tags, 7 mm rectal polyp sessile, oozing s/p resection requiring epi and cautery, s/p clip placement. Benign colon polyp. Screening in 10 years.    LEEP      OB History     Gravida  3   Para  3   Term  3   Preterm      AB      Living  3      SAB      IAB      Ectopic      Multiple      Live Births               Home Medications    Prior to Admission medications   Medication Sig Start Date End Date Taking? Authorizing Provider  amLODipine (NORVASC) 5 MG tablet Take 1 tablet (5 mg total) by mouth daily. 11/09/20   Anders Simmonds, PA-C  atorvastatin (LIPITOR) 20 MG tablet Take 1 tablet (20 mg total) by mouth daily. 11/09/20   Anders Simmonds, PA-C  chlorhexidine (PERIDEX) 0.12 %  solution Use as directed 15 mLs in the mouth or throat 2 (two) times daily. Swish for 30 seconds, spit out 10/08/20   Wieters, Hallie C, PA-C  ibuprofen (ADVIL) 600 MG tablet Take 1 tablet (600 mg total) by mouth every 6 (six) hours as needed. 10/08/20   Wieters, Hallie C, PA-C  naproxen (NAPROSYN) 500 MG tablet Take 1 tablet (500 mg total) by mouth 2 (two) times daily. 11/09/20   Anders Simmonds, PA-C    Family History Family History  Problem Relation Age of Onset   Hypertension Mother    Hypertension Father    Colon cancer Neg Hx     Social History Social History   Tobacco Use   Smoking status: Never   Smokeless tobacco: Never  Vaping Use   Vaping Use: Never used  Substance Use Topics   Alcohol use: No   Drug use: No     Allergies   Patient has no known allergies.   Review of Systems Review of Systems Pertinent negatives listed in HPI   Physical Exam Triage Vital Signs ED Triage Vitals [12/10/20  1349]  Enc Vitals Group     BP 137/74     Pulse Rate 72     Resp 16     Temp 98 F (36.7 C)     Temp Source Oral     SpO2 98 %     Weight      Height      Head Circumference      Peak Flow      Pain Score 0     Pain Loc      Pain Edu?      Excl. in GC?    No data found.  Updated Vital Signs BP 137/74 (BP Location: Left Arm)   Pulse 72   Temp 98 F (36.7 C) (Oral)   Resp 16   LMP 07/28/2016   SpO2 98%   Visual Acuity Right Eye Distance:   Left Eye Distance:   Bilateral Distance:    Right Eye Near:   Left Eye Near:    Bilateral Near:     Physical Exam Constitutional:      Appearance: Normal appearance.  Eyes:     Conjunctiva/sclera:     Right eye: Right conjunctiva is not injected. No chemosis, exudate or hemorrhage.    Left eye: Left conjunctiva is not injected. No chemosis, exudate or hemorrhage.  Cardiovascular:     Rate and Rhythm: Normal rate and regular rhythm.  Pulmonary:     Effort: Pulmonary effort is normal.     Breath sounds:  Normal breath sounds.  Neurological:     Mental Status: She is alert.     UC Treatments / Results  Labs (all labs ordered are listed, but only abnormal results are displayed) Labs Reviewed - No data to display  EKG   Radiology No results found.  Procedures Procedures (including critical care time)  Medications Ordered in UC Medications - No data to display  Initial Impression / Assessment and Plan / UC Course  I have reviewed the triage vital signs and the nursing notes.  Pertinent labs & imaging results that were available during my care of the patient were reviewed by me and considered in my medical decision making (see chart for details).    Cyst of the right orbit information provided to follow-up with ophthalmology on Monday.  Patient's vision remains intact therefore this is not emergent and does not warrant emergency room evaluation however she will need follow-up with ophthalmology to evaluate and treat cystic lesion.  Patient verbalized understanding and agreement with plan. Final Clinical Impressions(s) / UC Diagnoses   Final diagnoses:  Cyst of orbit, right     Discharge Instructions      Contact the ophthalmologist listed on your discharge paperwork first thing Monday morning for further work-up and evaluation of the cystic lesion on your eye.     ED Prescriptions   None    PDMP not reviewed this encounter.   Bing Neighbors, FNP 12/10/20 1420

## 2020-12-10 NOTE — ED Triage Notes (Signed)
Eye discomfort x 1 week. Noticed "bubble" on sclera yesterday. Yellowish raised nodule on medial sclera

## 2020-12-17 ENCOUNTER — Other Ambulatory Visit: Payer: Self-pay

## 2020-12-17 ENCOUNTER — Ambulatory Visit
Admission: RE | Admit: 2020-12-17 | Discharge: 2020-12-17 | Disposition: A | Discharge status: Home / Self Care | Payer: No Typology Code available for payment source | Source: Ambulatory Visit | Attending: Urgent Care | Admitting: Urgent Care

## 2020-12-17 DIAGNOSIS — J069 Acute upper respiratory infection, unspecified: Secondary | ICD-10-CM | POA: Insufficient documentation

## 2020-12-17 DIAGNOSIS — R070 Pain in throat: Secondary | ICD-10-CM | POA: Insufficient documentation

## 2020-12-17 LAB — POCT RAPID STREP A (OFFICE): Rapid Strep A Screen: NEGATIVE

## 2020-12-17 MED ORDER — BENZONATATE 100 MG PO CAPS: 100.0000 mg | ORAL_CAPSULE | Freq: Three times a day (TID) | ORAL | 0 refills | Status: DC | PRN

## 2020-12-17 MED ORDER — PROMETHAZINE-DM 6.25-15 MG/5ML PO SYRP: 5.0000 mL | ORAL_SOLUTION | Freq: Every evening | ORAL | 0 refills | Status: DC | PRN

## 2020-12-17 MED ORDER — CETIRIZINE HCL 10 MG PO TABS: 10.0000 mg | ORAL_TABLET | Freq: Every day | ORAL | 0 refills | Status: DC

## 2020-12-17 MED ORDER — PSEUDOEPHEDRINE HCL 60 MG PO TABS: 60.0000 mg | ORAL_TABLET | Freq: Three times a day (TID) | ORAL | 0 refills | Status: DC | PRN

## 2020-12-17 NOTE — ED Triage Notes (Signed)
Four day h/o sore throat and intermittent cough. Throat pain has worsened since the onset. Confirms dysphagia and abdominal pain. Has been taking delsym and salt wa\ter gargles without a decrease in sxs. No emesis or diarrhea. Pain in interfering with her sleep.

## 2020-12-17 NOTE — ED Provider Notes (Signed)
[facility]   MRN: [id number] DOB: [birth year]  Subjective:   Brenda [name] is a 55 y.o. female presenting for 5-day history of acute onset sore throat, intermittent cough that elicits chest pain and epigastric pain, malaise and fatigue, hoarseness of voice. Has COVID vaccination, booster as well.  She did a test at home and was negative.  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  Denies shortness of breath, wheezing, body aches, fevers.  No current facility-administered medications for this encounter.  Current Outpatient Medications:    amLODipine (NORVASC) 5 MG tablet, Take 1 tablet (5 mg total) by mouth daily., Disp: 90 tablet, Rfl: 1   atorvastatin (LIPITOR) 20 MG tablet, Take 1 tablet (20 mg total) by mouth daily., Disp: 90 tablet, Rfl: 3   chlorhexidine (PERIDEX) 0.12 % solution, Use as directed 15 mLs in the mouth or throat 2 (two) times daily. Swish for 30 seconds, spit out, Disp: 120 mL, Rfl: 0   ibuprofen (ADVIL) 600 MG tablet, Take 1 tablet (600 mg total) by mouth every 6 (six) hours as needed., Disp: 30 tablet, Rfl: 0   naproxen (NAPROSYN) 500 MG tablet, Take 1 tablet (500 mg total) by mouth 2 (two) times daily., Disp: 30 tablet, Rfl: 1   No Known Allergies  Past Medical History:  Diagnosis Date   Abnormal Pap smear    Carpal tunnel syndrome    Hypercholesterolemia    Hypertension      Past Surgical History:  Procedure Laterality Date   COLONOSCOPY N/A 08/12/2019   Large skin tag right buttock, 2 small hemorrhoid tags, 7 mm rectal polyp sessile, oozing s/p resection requiring epi and cautery, s/p clip placement. Benign colon polyp. Screening in 10 years.    LEEP      Family History  Problem Relation Age of Onset   Hypertension Mother    Hypertension Father    Colon cancer Neg Hx     Social History   Tobacco Use   Smoking status: Never   Smokeless tobacco: Never  Vaping  Use   Vaping Use: Never used  Substance Use Topics   Alcohol use: No   Drug use: No    ROS   Objective:   Vitals: BP 147/86, pulse 66, ox 97%, 98.35F, RR 18  Physical Exam Constitutional:      General: She is not in acute distress.    Appearance: Normal appearance. She is well-developed. She is not ill-appearing, toxic-appearing or diaphoretic.  HENT:     Head: Normocephalic and atraumatic.     Right Ear: Tympanic membrane, ear canal and external ear normal. No drainage or tenderness. No middle ear effusion. There is no impacted cerumen. Tympanic membrane is not erythematous.     Left Ear: Tympanic membrane, ear canal and external ear normal. No drainage or tenderness.  No middle ear effusion. There is no impacted cerumen. Tympanic membrane is not erythematous.     Nose: Congestion present. No rhinorrhea.     Comments: Thick postnasal drainage coating the pharynx.    Mouth/Throat:     Mouth: Mucous membranes are moist. No oral lesions.     Pharynx: No pharyngeal swelling, oropharyngeal exudate, posterior oropharyngeal erythema or uvula swelling.     Tonsils: No tonsillar exudate or tonsillar abscesses.  Eyes:     General: No scleral icterus.       Right eye: No discharge.        Left  eye: No discharge.     Extraocular Movements: Extraocular movements intact.     Right eye: Normal extraocular motion.     Left eye: Normal extraocular motion.     Conjunctiva/sclera: Conjunctivae normal.     Pupils: Pupils are equal, round, and reactive to light.  Cardiovascular:     Rate and Rhythm: Normal rate and regular rhythm.     Pulses: Normal pulses.     Heart sounds: Normal heart sounds. No murmur heard.   No friction rub. No gallop.  Pulmonary:     Effort: Pulmonary effort is normal. No respiratory distress.     Breath sounds: Normal breath sounds. No stridor. No wheezing, rhonchi or rales.  Chest:     Chest wall: No tenderness.  Musculoskeletal:     Cervical back: Normal range  of motion and neck supple.  Lymphadenopathy:     Cervical: No cervical adenopathy.  Skin:    General: Skin is warm and dry.     Findings: No rash.  Neurological:     General: No focal deficit present.     Mental Status: She is alert and oriented to person, place, and time.  Psychiatric:        Mood and Affect: Mood normal.        Behavior: Behavior normal.        Thought Content: Thought content normal.        Judgment: Judgment normal.    Results for orders placed or performed during the hospital encounter of 12/17/20 (from the past 24 hour(s))  POCT rapid strep A     Status: None   Collection Time: 12/17/20  2:07 PM  Result Value Ref Range   Rapid Strep A Screen Negative Negative    Assessment and Plan :   PDMP not reviewed this encounter.  1. Viral URI with cough   2. Throat pain     Strep culture pending. Will manage for viral illness such as viral URI, viral syndrome, viral rhinitis, COVID-19. Counseled patient on nature of COVID-19 including modes of transmission, diagnostic testing, management and supportive care.  Offered scripts for symptomatic relief. COVID 19 testing is pending.  Low suspicion for pneumonia, clear cardiopulmonary exam, deferred imaging.  Low suspicion for retropharyngeal abscess, epiglottitis given physical exam findings, hemodynamically stable vital signs.  Counseled patient on potential for adverse effects with medications prescribed/recommended today, ER and return-to-clinic precautions discussed, patient verbalized understanding.     Wallis Bamberg, [place] 12/17/20 1432

## 2020-12-19 LAB — SARS-COV-2, NAA 2 DAY TAT

## 2020-12-19 LAB — CULTURE, GROUP A STREP (THRC)

## 2020-12-19 LAB — NOVEL CORONAVIRUS, NAA: SARS-CoV-2, NAA: DETECTED — AB

## 2021-07-13 ENCOUNTER — Telehealth: Payer: Self-pay

## 2021-07-13 NOTE — Telephone Encounter (Signed)
I return Pt call, Pt will call back on April ?

## 2021-07-13 NOTE — Telephone Encounter (Signed)
Copied from [place] [id number]. Topic: Appointment Scheduling - Scheduling Inquiry for Clinic ?>> Jul 13, 2021  9:02 AM Oneta Rack wrote: ?Reason for CRM:  Patient would like to re apply for the orange card she stated she was denied because she made to much money she wants to see if she will get approved this time ?

## 2021-08-23 ENCOUNTER — Ambulatory Visit
Admission: RE | Admit: 2021-08-23 | Discharge: 2021-08-23 | Disposition: A | Discharge status: Home / Self Care | Payer: No Typology Code available for payment source | Source: Ambulatory Visit | Attending: Internal Medicine | Admitting: Internal Medicine

## 2021-08-23 VITALS — BP 136/78 | HR 68 | Temp 98.2°F | Resp 18

## 2021-08-23 DIAGNOSIS — H6591 Unspecified nonsuppurative otitis media, right ear: Secondary | ICD-10-CM

## 2021-08-23 MED ORDER — PREDNISONE 20 MG PO TABS: 40.0000 mg | ORAL_TABLET | Freq: Every day | ORAL | 0 refills | Status: AC

## 2021-08-23 NOTE — Discharge Instructions (Signed)
You have fluid behind your eardrum which is being treated with prednisone steroid.  Please follow-up if symptoms persist or worsen. ?

## 2021-08-23 NOTE — ED Provider Notes (Signed)
?[name] ? ? ? ?CSN: [id number] ?Arrival date & time: 08/23/21  1632 ? ? ?  ? ?History   ?Chief Complaint ?Chief Complaint  ?Patient presents with  ? Ear Fullness  ? ? ?HPI ?Brenda [name] is a 56 y.o. female.  ? ?Patient presents with right ear feelings of ear fullness and headache that has been present for the past few days.  Patient reports that she recently had cold-like symptoms that have now resolved but the ear discomfort has been persistent.  Denies any fevers, chest pain, shortness of breath.  Denies any pain to the ear.  Denies drainage, foreign body, trauma, decreased hearing from the ear. ? ? ?Ear Fullness ? ? ?Past Medical History:  ?Diagnosis Date  ? Abnormal Pap smear   ? Carpal tunnel syndrome   ? Hypercholesterolemia   ? Hypertension   ? ? ?Patient Active Problem List  ? Diagnosis Date Noted  ? Hemorrhoids 06/01/2019  ? Constipation 06/01/2019  ? Preventative health care 06/01/2019  ? ? ?Past Surgical History:  ?Procedure Laterality Date  ? COLONOSCOPY N/A 08/12/2019  ? Large skin tag right buttock, 2 small hemorrhoid tags, 7 mm rectal polyp sessile, oozing s/p resection requiring epi and cautery, s/p clip placement. Benign colon polyp. Screening in 10 years.   ? LEEP    ? ? ?OB History   ? ? Gravida  ?3  ? Para  ?3  ? Term  ?3  ? Preterm  ?   ? AB  ?   ? Living  ?3  ?  ? ? SAB  ?   ? IAB  ?   ? Ectopic  ?   ? Multiple  ?   ? Live Births  ?   ?   ?  ?  ? ? ? ?Home Medications   ? ?Prior to Admission medications   ?Medication Sig Start Date End Date Taking? Authorizing Provider  ?predniSONE (DELTASONE) 20 MG tablet Take 2 tablets (40 mg total) by mouth daily for 5 days. 08/23/21 08/28/21 Yes Ayaan Ringle, Acie Fredrickson, FNP  ?amLODipine (NORVASC) 5 MG tablet Take 1 tablet (5 mg total) by mouth daily. 11/09/20   Anders Simmonds, PA-C  ?atorvastatin (LIPITOR) 20 MG tablet Take 1 tablet (20 mg total) by mouth daily. 11/09/20   Anders Simmonds, PA-C  ?benzonatate (TESSALON) 100 MG capsule Take  1-2 capsules (100-200 mg total) by mouth 3 (three) times daily as needed for cough. 12/17/20   Wallis Bamberg, PA-C  ?cetirizine (ZYRTEC ALLERGY) 10 MG tablet Take 1 tablet (10 mg total) by mouth daily. 12/17/20   Wallis Bamberg, PA-C  ?chlorhexidine (PERIDEX) 0.12 % solution Use as directed 15 mLs in the mouth or throat 2 (two) times daily. Swish for 30 seconds, spit out 10/08/20   Wieters, [employer] C, PA-C  ?ibuprofen (ADVIL) 600 MG tablet Take 1 tablet (600 mg total) by mouth every 6 (six) hours as needed. 10/08/20   Wieters, Hallie C, PA-C  ?naproxen (NAPROSYN) 500 MG tablet Take 1 tablet (500 mg total) by mouth 2 (two) times daily. 11/09/20   Anders Simmonds, PA-C  ?promethazine-dextromethorphan (PROMETHAZINE-DM) 6.25-15 MG/5ML syrup Take 5 mLs by mouth at bedtime as needed for cough. 12/17/20   Wallis Bamberg, PA-C  ?pseudoephedrine (SUDAFED) 60 MG tablet Take 1 tablet (60 mg total) by mouth every 8 (eight) hours as needed for congestion. 12/17/20   Wallis Bamberg, PA-C  ? ? ?Family History ?Family History  ?Problem Relation Age of Onset  ?  Hypertension Mother   ? Hypertension Father   ? Colon cancer Neg Hx   ? ? ?Social History ?Social History  ? ?Tobacco Use  ? Smoking status: Never  ? Smokeless tobacco: Never  ?Vaping Use  ? Vaping Use: Never used  ?Substance Use Topics  ? Alcohol use: No  ? Drug use: No  ? ? ? ?Allergies   ?Patient has no known allergies. ? ? ?Review of Systems ?Review of Systems ?Per HPI ? ?Physical Exam ?Triage Vital Signs ?ED Triage Vitals  ?Enc Vitals Group  ?   BP 08/23/21 1713 136/78  ?   Pulse Rate 08/23/21 1713 68  ?   Resp 08/23/21 1713 18  ?   Temp 08/23/21 1713 98.2 ?F (36.8 ?C)  ?   Temp Source 08/23/21 1713 Oral  ?   SpO2 08/23/21 1713 98 %  ?   Weight --   ?   Height --   ?   Head Circumference --   ?   Peak Flow --   ?   Pain Score 08/23/21 1712 0  ?   Pain Loc --   ?   Pain Edu? --   ?   Excl. in GC? --   ? ?No data found. ? ?Updated Vital Signs ?BP 136/78 (BP Location: Left Arm)   Pulse 68    Temp 98.2 ?F (36.8 ?C) (Oral)   Resp 18   LMP 07/28/2016   SpO2 98%  ? ?Visual Acuity ?Right Eye Distance:   ?Left Eye Distance:   ?Bilateral Distance:   ? ?Right Eye Near:   ?Left Eye Near:    ?Bilateral Near:    ? ?Physical Exam ?Constitutional:   ?   General: She is not in acute distress. ?   Appearance: Normal appearance. She is not toxic-appearing or diaphoretic.  ?HENT:  ?   Head: Normocephalic and atraumatic.  ?   Right Ear: Ear canal and external ear normal. No drainage, swelling or tenderness. A middle ear effusion is present. No mastoid tenderness. Tympanic membrane is not perforated, erythematous or bulging.  ?   Left Ear: Tympanic membrane, ear canal and external ear normal.  ?   Nose: Nose normal.  ?Eyes:  ?   Extraocular Movements: Extraocular movements intact.  ?   Conjunctiva/sclera: Conjunctivae normal.  ?Pulmonary:  ?   Effort: Pulmonary effort is normal.  ?Neurological:  ?   General: No focal deficit present.  ?   Mental Status: She is alert and oriented to person, place, and time. Mental status is at baseline.  ?   Cranial Nerves: Cranial nerves 2-12 are intact.  ?   Sensory: Sensation is intact.  ?   Motor: Motor function is intact.  ?   Coordination: Coordination is intact.  ?   Gait: Gait is intact.  ?Psychiatric:     ?   Mood and Affect: Mood normal.     ?   Behavior: Behavior normal.     ?   Thought Content: Thought content normal.     ?   Judgment: Judgment normal.  ? ? ? ?UC Treatments / Results  ?Labs ?(all labs ordered are listed, but only abnormal results are displayed) ?Labs Reviewed - No data to display ? ?EKG ? ? ?Radiology ?No results found. ? ?Procedures ?Procedures (including critical care time) ? ?Medications Ordered in UC ?Medications - No data to display ? ?Initial Impression / Assessment and Plan / UC Course  ?I have reviewed the  triage vital signs and the nursing notes. ? ?Pertinent labs & imaging results that were available during my care of the patient were reviewed by  me and considered in my medical decision making (see chart for details). ? ?  ? ?Patient has right middle ear effusion.  Will treat with prednisone steroid.  Other symptoms appear stable and resolved.  No concern for worrisome etiologies for headache as it appears that right ear discomfort is most likely causing headache.  Discussed return and ER precautions.  Patient verbalized understanding and was agreeable with plan. ?Final Clinical Impressions(s) / UC Diagnoses  ? ?Final diagnoses:  ?Fluid level behind tympanic membrane of right ear  ? ? ? ?Discharge Instructions   ? ?  ?You have fluid behind your eardrum which is being treated with prednisone steroid.  Please follow-up if symptoms persist or worsen. ? ? ? ?ED Prescriptions   ? ? Medication Sig Dispense Auth. Provider  ? predniSONE (DELTASONE) 20 MG tablet Take 2 tablets (40 mg total) by mouth daily for 5 days. 10 tablet Ervin Knack E, [place]  ? ?  ? ?PDMP not reviewed this encounter. ?  ?Gustavus Bryant, [place] ?08/23/21 1721 ? ?

## 2021-08-23 NOTE — ED Triage Notes (Signed)
Pt presents with c/o R ear fullness and headaches. Pt states she recently had a cold. ?

## 2021-09-20 ENCOUNTER — Ambulatory Visit: Payer: Self-pay | Attending: Nurse Practitioner | Admitting: Nurse Practitioner

## 2021-09-20 ENCOUNTER — Encounter: Payer: Self-pay | Admitting: Nurse Practitioner

## 2021-09-20 VITALS — BP 139/84 | HR 60 | Wt 130.0 lb

## 2021-09-20 DIAGNOSIS — I10 Essential (primary) hypertension: Secondary | ICD-10-CM

## 2021-09-20 DIAGNOSIS — Z130 Encounter for screening for diseases of the blood and blood-forming organs and certain disorders involving the immune mechanism: Secondary | ICD-10-CM

## 2021-09-20 DIAGNOSIS — E7800 Pure hypercholesterolemia, unspecified: Secondary | ICD-10-CM

## 2021-09-20 DIAGNOSIS — Z1159 Encounter for screening for other viral diseases: Secondary | ICD-10-CM

## 2021-09-20 DIAGNOSIS — Z1231 Encounter for screening mammogram for malignant neoplasm of breast: Secondary | ICD-10-CM

## 2021-09-20 DIAGNOSIS — Z114 Encounter for screening for human immunodeficiency virus [HIV]: Secondary | ICD-10-CM

## 2021-09-20 DIAGNOSIS — Z23 Encounter for immunization: Secondary | ICD-10-CM

## 2021-09-20 NOTE — Progress Notes (Signed)
? ?Assessment & Plan:  ?Brenda [name] was seen today for hypertension and medication refill. ? ?Diagnoses and all orders for this visit: ? ?Primary hypertension ?-     CMP14+EGFR ?Continue amlodipine daily as prescribed.  Continue to monitor blood pressure at home periodically.  DASH diet recommended ? ?Primary hypercholesterolemia ?-     Lipid panel ? ?Encounter for screening for HIV ?-     HIV antibody (with reflex) ? ?Need for hepatitis C screening test ?-     HCV Ab w Reflex to Quant PCR ? ?Screening for deficiency anemia ?-     CBC ? ?Breast cancer screening by mammogram ?-     MS DIGITAL SCREENING TOMO BILATERAL; Future ? ?Need for shingles vaccine ? ? ? ?Patient has been counseled on age-appropriate routine health concerns for screening and prevention. These are reviewed and up-to-date. Referrals have been placed accordingly. Immunizations are up-to-date or declined.    ?Subjective:  ? ?Chief Complaint  ?Patient presents with  ? Hypertension  ? Medication Refill  ? ? ?Brenda [name] 56 y.o. female presents to office today for follow-up to hypertension. ?Past medical history significant only for hypertension and hypercholesterolemia ? ? ?HTN ?Blood pressure is well controlled today with amlodipine 5 mg daily.  She reports home readings usually around 120/70s. ?BP Readings from Last 3 Encounters:  ?09/20/21 139/84  ?08/23/21 136/78  ?12/10/20 137/74  ? ?She has also scratchy throat today ever since she had a cold 2 months ago.  Denies any other symptoms such as dysphagia, odynophagia, lymphadenopathy or fever.  I have recommended she resume her cetirizine for the next 30 days. ? ? ? ?Review of Systems  ?Constitutional:  Negative for fever, malaise/fatigue and weight loss.  ?HENT: Negative.  Negative for congestion, ear discharge, ear pain, hearing loss, nosebleeds, sore throat and tinnitus.   ?     See HPI  ?Eyes: Negative.  Negative for blurred vision, double vision and photophobia.  ?Respiratory: Negative.   Negative for cough, shortness of breath and stridor.   ?Cardiovascular: Negative.  Negative for chest pain, palpitations and leg swelling.  ?Gastrointestinal: Negative.  Negative for heartburn, nausea and vomiting.  ?Musculoskeletal: Negative.  Negative for myalgias.  ?Neurological: Negative.  Negative for dizziness, focal weakness, seizures and headaches.  ?Psychiatric/Behavioral: Negative.  Negative for suicidal ideas.   ? ?Past Medical History:  ?Diagnosis Date  ? Abnormal Pap smear   ? Carpal tunnel syndrome   ? Hypercholesterolemia   ? Hypertension   ? ? ?Past Surgical History:  ?Procedure Laterality Date  ? COLONOSCOPY N/A 08/12/2019  ? Large skin tag right buttock, 2 small hemorrhoid tags, 7 mm rectal polyp sessile, oozing s/p resection requiring epi and cautery, s/p clip placement. Benign colon polyp. Screening in 10 years.   ? LEEP    ? ? ?Family History  ?Problem Relation Age of Onset  ? Hypertension Mother   ? Hypertension Father   ? Colon cancer Neg Hx   ? ? ?Social History Reviewed with no changes to be made today.  ? ?Outpatient Medications Prior to Visit  ?Medication Sig Dispense Refill  ? amLODipine (NORVASC) 5 MG tablet Take 1 tablet (5 mg total) by mouth daily. 90 tablet 1  ? atorvastatin (LIPITOR) 20 MG tablet Take 1 tablet (20 mg total) by mouth daily. 90 tablet 3  ? cetirizine (ZYRTEC ALLERGY) 10 MG tablet Take 1 tablet (10 mg total) by mouth daily. 30 tablet 0  ? benzonatate (TESSALON) 100 MG capsule Take  1-2 capsules (100-200 mg total) by mouth 3 (three) times daily as needed for cough. 60 capsule 0  ? chlorhexidine (PERIDEX) 0.12 % solution Use as directed 15 mLs in the mouth or throat 2 (two) times daily. Swish for 30 seconds, spit out 120 mL 0  ? ibuprofen (ADVIL) 600 MG tablet Take 1 tablet (600 mg total) by mouth every 6 (six) hours as needed. 30 tablet 0  ? naproxen (NAPROSYN) 500 MG tablet Take 1 tablet (500 mg total) by mouth 2 (two) times daily. 30 tablet 1  ?  promethazine-dextromethorphan (PROMETHAZINE-DM) 6.25-15 MG/5ML syrup Take 5 mLs by mouth at bedtime as needed for cough. 100 mL 0  ? pseudoephedrine (SUDAFED) 60 MG tablet Take 1 tablet (60 mg total) by mouth every 8 (eight) hours as needed for congestion. 30 tablet 0  ? ?No facility-administered medications prior to visit.  ? ? ?No Known Allergies ? ?   ?Objective:  ?  ?BP 139/84   Pulse 60   Wt 130 lb (59 kg)   LMP 07/28/2016   SpO2 98%   BMI 25.39 kg/m?  ?Wt Readings from Last 3 Encounters:  ?09/20/21 130 lb (59 kg)  ?11/09/20 132 lb 9.6 oz (60.1 kg)  ?10/29/19 126 lb 1 oz (57.2 kg)  ? ? ?Physical Exam ?Vitals and nursing note reviewed.  ?Constitutional:   ?   Appearance: She is well-developed.  ?HENT:  ?   Head: Normocephalic and atraumatic.  ?Cardiovascular:  ?   Rate and Rhythm: Normal rate and regular rhythm.  ?   Heart sounds: Normal heart sounds. No murmur heard. ?  No friction rub. No gallop.  ?Pulmonary:  ?   Effort: Pulmonary effort is normal. No tachypnea or respiratory distress.  ?   Breath sounds: Normal breath sounds. No decreased breath sounds, wheezing, rhonchi or rales.  ?Chest:  ?   Chest wall: No tenderness.  ?Abdominal:  ?   General: Bowel sounds are normal.  ?   Palpations: Abdomen is soft.  ?Musculoskeletal:     ?   General: Normal range of motion.  ?   Cervical back: Normal range of motion.  ?Skin: ?   General: Skin is warm and dry.  ?Neurological:  ?   Mental Status: She is alert and oriented to person, place, and time.  ?   Coordination: Coordination normal.  ?Psychiatric:     ?   Behavior: Behavior normal. Behavior is cooperative.     ?   Thought Content: Thought content normal.     ?   Judgment: Judgment normal.  ? ? ? ? ?   ?Patient has been counseled extensively about nutrition and exercise as well as the importance of adherence with medications and regular follow-up. The patient was given clear instructions to go to ER or return to medical center if symptoms don't improve, worsen  or new problems develop. The patient verbalized understanding.  ? ?Follow-up: Return for Nurse visit shingles vaccine 3 months. See me in 6 months.  ? ?Gildardo Pounds, FNP-BC ?[place] ?[place], [place] ?[phone]   ?09/20/2021, 1:58 PM ?

## 2021-09-21 LAB — CMP14+EGFR
ALT: 10 IU/L (ref 0–32)
AST: 14 IU/L (ref 0–40)
Albumin/Globulin Ratio: 1.6 (ref 1.2–2.2)
Albumin: 4.5 g/dL (ref 3.8–4.9)
Alkaline Phosphatase: 99 IU/L (ref 44–121)
BUN/Creatinine Ratio: 17 (ref 9–23)
BUN: 11 mg/dL (ref 6–24)
Bilirubin Total: 0.2 mg/dL (ref 0.0–1.2)
CO2: 23 mmol/L (ref 20–29)
Calcium: 9.8 mg/dL (ref 8.7–10.2)
Chloride: 104 mmol/L (ref 96–106)
Creatinine, Ser: 0.66 mg/dL (ref 0.57–1.00)
Globulin, Total: 2.9 g/dL (ref 1.5–4.5)
Glucose: 86 mg/dL (ref 70–99)
Potassium: 3.9 mmol/L (ref 3.5–5.2)
Sodium: 141 mmol/L (ref 134–144)
Total Protein: 7.4 g/dL (ref 6.0–8.5)
eGFR: 105 mL/min/{1.73_m2} (ref >59)

## 2021-09-21 LAB — CBC
Hematocrit: 36.5 % (ref 34.0–46.6)
Hemoglobin: 12.2 g/dL (ref 11.1–15.9)
MCH: 30 pg (ref 26.6–33.0)
MCHC: 33.4 g/dL (ref 31.5–35.7)
MCV: 90 fL (ref 79–97)
Platelets: 334 10*3/uL (ref 150–450)
RBC: 4.07 x10E6/uL (ref 3.77–5.28)
RDW: 13 % (ref 11.7–15.4)
WBC: 6.3 10*3/uL (ref 3.4–10.8)

## 2021-09-21 LAB — LIPID PANEL
Chol/HDL Ratio: 2.2 ratio (ref 0.0–4.4)
Cholesterol, Total: 218 mg/dL — ABNORMAL HIGH (ref 100–199)
HDL: 97 mg/dL (ref >39)
LDL Chol Calc (NIH): 105 mg/dL — ABNORMAL HIGH (ref 0–99)
Triglycerides: 92 mg/dL (ref 0–149)
VLDL Cholesterol Cal: 16 mg/dL (ref 5–40)

## 2021-09-21 LAB — HCV AB W REFLEX TO QUANT PCR: HCV Ab: NONREACTIVE

## 2021-09-21 LAB — HIV ANTIBODY (ROUTINE TESTING W REFLEX): HIV Screen 4th Generation wRfx: NONREACTIVE

## 2021-09-21 LAB — HCV INTERPRETATION

## 2021-11-16 ENCOUNTER — Ambulatory Visit
Admission: RE | Admit: 2021-11-16 | Discharge: 2021-11-16 | Disposition: A | Discharge status: Home / Self Care | Payer: No Typology Code available for payment source | Source: Ambulatory Visit | Attending: Nurse Practitioner | Admitting: Nurse Practitioner

## 2021-11-16 DIAGNOSIS — Z1231 Encounter for screening mammogram for malignant neoplasm of breast: Secondary | ICD-10-CM

## 2021-11-17 ENCOUNTER — Other Ambulatory Visit: Payer: Self-pay | Admitting: Physician Assistant

## 2021-11-17 ENCOUNTER — Other Ambulatory Visit: Payer: Self-pay | Admitting: Nurse Practitioner

## 2021-11-17 DIAGNOSIS — E785 Hyperlipidemia, unspecified: Secondary | ICD-10-CM

## 2021-11-17 DIAGNOSIS — I10 Essential (primary) hypertension: Secondary | ICD-10-CM

## 2021-11-17 NOTE — Telephone Encounter (Signed)
Requested Prescriptions  Pending Prescriptions Disp Refills  . atorvastatin (LIPITOR) 20 MG tablet [Pharmacy Med Name: Atorvastatin Calcium 20 MG Oral Tablet] 90 tablet 0    Sig: Take 1 tablet by mouth once daily     Cardiovascular:  Antilipid - Statins Failed - 11/17/2021  3:39 PM      Failed - Lipid Panel in normal range within the last 12 months    Cholesterol, Total  Date Value Ref Range Status  09/20/2021 218 (H) 100 - 199 mg/dL Final   LDL Chol Calc (NIH)  Date Value Ref Range Status  09/20/2021 105 (H) 0 - 99 mg/dL Final   HDL  Date Value Ref Range Status  09/20/2021 97 >39 mg/dL Final   Triglycerides  Date Value Ref Range Status  09/20/2021 92 0 - 149 mg/dL Final         Passed - Patient is not pregnant      Passed - Valid encounter within last 12 months    Recent Outpatient Visits          1 month ago Primary hypertension   [facility] [place], Shea Stakes, NP   1 year ago Thumb tendonitis   [facility] [place], [place], [place]   1 year ago Encounter to establish care   [facility] [place], Shea Stakes, NP   2 years ago Annual physical exam   [facility] Hoy Register, MD   2 years ago Chronic dental pain   [facility] Hoy Register, MD      Future Appointments            In 4 months Claiborne Rigg, NP [facility]

## 2021-11-17 NOTE — Telephone Encounter (Signed)
Expired Rx-appointment 09/20/21 states to continue- will RF per chart note Requested Prescriptions  Pending Prescriptions Disp Refills  . amLODipine (NORVASC) 5 MG tablet [Pharmacy Med Name: amLODIPine Besylate 5 MG Oral Tablet] 90 tablet 0    Sig: Take 1 tablet by mouth once daily     Cardiovascular: Calcium Channel Blockers 2 Passed - 11/17/2021  3:39 PM      Passed - Last BP in normal range    BP Readings from Last 1 Encounters:  09/20/21 139/84         Passed - Last Heart Rate in normal range    Pulse Readings from Last 1 Encounters:  09/20/21 60         Passed - Valid encounter within last 6 months    Recent Outpatient Visits          1 month ago Primary hypertension   [facility] [place], Shea Stakes, NP   1 year ago Thumb tendonitis   [facility] [place], [place], [place]   1 year ago Encounter to establish care   [facility] [place], Shea Stakes, NP   2 years ago Annual physical exam   [facility] Hoy Register, MD   2 years ago Chronic dental pain   [facility] Hoy Register, MD      Future Appointments            In 4 months Claiborne Rigg, NP [facility]

## 2021-12-21 ENCOUNTER — Ambulatory Visit: Payer: Self-pay | Attending: Nurse Practitioner

## 2021-12-21 DIAGNOSIS — Z23 Encounter for immunization: Secondary | ICD-10-CM

## 2021-12-21 NOTE — Progress Notes (Signed)
Pt arrived for 2nd shingles vaccine Vaccine given in left deltoid muscle. Pt tolerated injection well.

## 2022-02-09 ENCOUNTER — Ambulatory Visit: Payer: Self-pay | Admitting: *Deleted

## 2022-02-09 NOTE — Telephone Encounter (Signed)
Called pt using [employer] [phone].  Voicemail left to call back.   Appt. Needed for neck pain for 3 months.

## 2022-02-09 NOTE — Telephone Encounter (Signed)
  Chief Complaint: neck pain requesting appt. Symptoms: middle back neck pain. Pain when looking up. No pain in arms. Pain patches OTC not effective. 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 Fell in tub beginning of year but not sure if this is the cause. Frequency: 3 months Pertinent Negatives: Patient denies chest pain no difficulty breathing no fever Disposition: [] ED /[] Urgent Care (no appt availability in office) / [x] Appointment(In office/virtual)/ []  [facility]/ [] Home Care/ [] Refused Recommended Disposition /[] [facility]/ []  Follow-up with PCP Additional Notes:   Requesting earlier appt than 03/23/22. None available until 03/14/22. If appt not available prior to Nov will wait to see PCP. Patient on wait list.    Reason for Disposition  Neck pain is a chronic symptom (recurrent or ongoing AND present > 4 weeks)  Answer Assessment - Initial Assessment Questions 1. ONSET: "When did the pain begin?"      3 months ago  2. LOCATION: "Where does it hurt?"      Back in middle of neck  3. PATTERN "Does the pain come and go, or has it been constant since it started?"      Now constant  4. SEVERITY: "How bad is the pain?"  (Scale 1-10; or mild, moderate, severe)   - NO PAIN (0): no pain or only slight stiffness    - MILD (1-3): doesn't interfere with normal activities    - MODERATE (4-7): interferes with normal activities or awakens from sleep    - SEVERE (8-10):  excruciating pain, unable to do any normal activities      Able to sleep using pain patches OTC not effective  5. RADIATION: "Does the pain go anywhere else, shoot into your arms?"     Carpel tunnel but does not affect hands and arms  6. CORD SYMPTOMS: "Any weakness or numbness of the arms or legs?"     No  7. CAUSE: "What do you think is causing the neck pain?"     Not sure  8. NECK OVERUSE: "Any recent activities that involved turning or twisting the neck?"     No , but had a fall in the tub  beginning of the year  9. OTHER SYMPTOMS: "Do you have any other symptoms?" (e.g., headache, fever, chest pain, difficulty breathing, neck swelling) Pain with looking up 10. PREGNANCY: "Is there any chance you are pregnant?" "When was your last menstrual period?"       na  Protocols used: Neck Pain or Stiffness-A-AH

## 2022-02-09 NOTE — Telephone Encounter (Signed)
Message from [place] sent at 02/09/2022 10:23 AM EDT  Summary: neck pain   Pt states she has had neck pain x66m   Please fu w/ pt   Pt requires an interpreter           Call History   Type Contact Phone/Fax User  02/09/2022 10:23 AM EDT Phone (Incoming) Fulgencio-Rojas, [place] (Self) [phone] ([address]) [place], [place]

## 2022-02-10 ENCOUNTER — Other Ambulatory Visit: Payer: Self-pay | Admitting: Nurse Practitioner

## 2022-02-10 DIAGNOSIS — I10 Essential (primary) hypertension: Secondary | ICD-10-CM

## 2022-02-12 NOTE — Telephone Encounter (Signed)
Requested Prescriptions  Pending Prescriptions Disp Refills  . amLODipine (NORVASC) 5 MG tablet [Pharmacy Med Name: amLODIPine Besylate 5 MG Oral Tablet] 90 tablet 0    Sig: Take 1 tablet by mouth once daily     Cardiovascular: Calcium Channel Blockers 2 Passed - 02/10/2022  9:12 AM      Passed - Last BP in normal range    BP Readings from Last 1 Encounters:  09/20/21 139/84         Passed - Last Heart Rate in normal range    Pulse Readings from Last 1 Encounters:  09/20/21 60         Passed - Valid encounter within last 6 months    Recent Outpatient Visits          4 months ago Primary hypertension   [place], Vernia Buff, NP   1 year ago Thumb tendonitis   [place], [place], [place]   1 year ago Encounter to establish care   [place], Vernia Buff, NP   2 years ago Annual physical exam   [place], [place]   2 years ago Chronic dental pain   [place], [place], MD      Future Appointments            In 1 month Gildardo Pounds, NP [place]

## 2022-03-12 IMAGING — MG DIGITAL SCREENING BILAT W/ TOMO W/ CAD
8 series · 9 of 24 positions shown · non-contrast
Comparison: Previous exam(s).

CLINICAL DATA: Screening.

EXAM:
DIGITAL SCREENING BILATERAL MAMMOGRAM WITH TOMO AND CAD

[R MLO synth-2D]
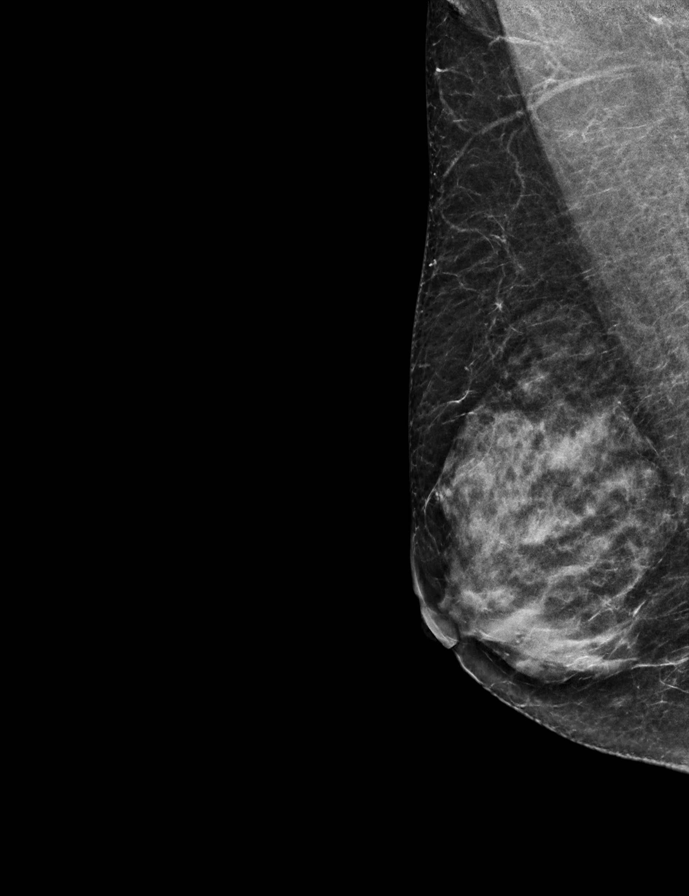

[L CC synth-2D]
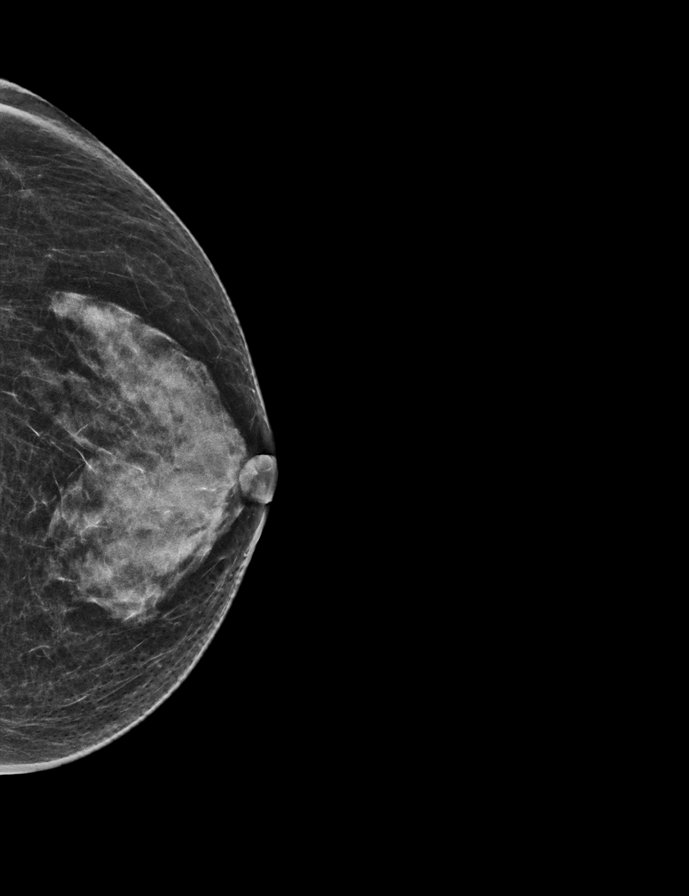

[R CC synth-2D]
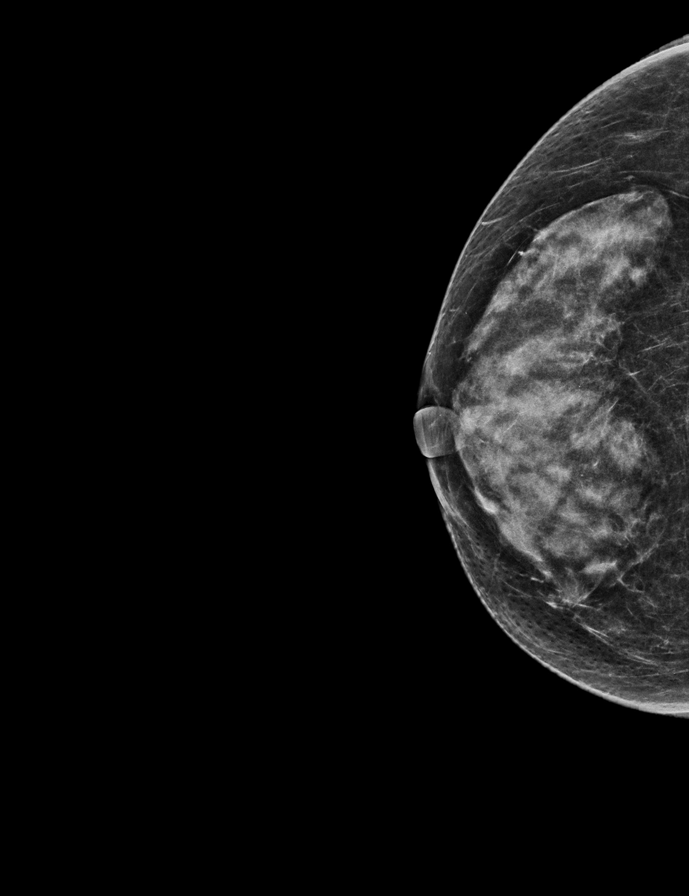

[L MLO synth-2D]
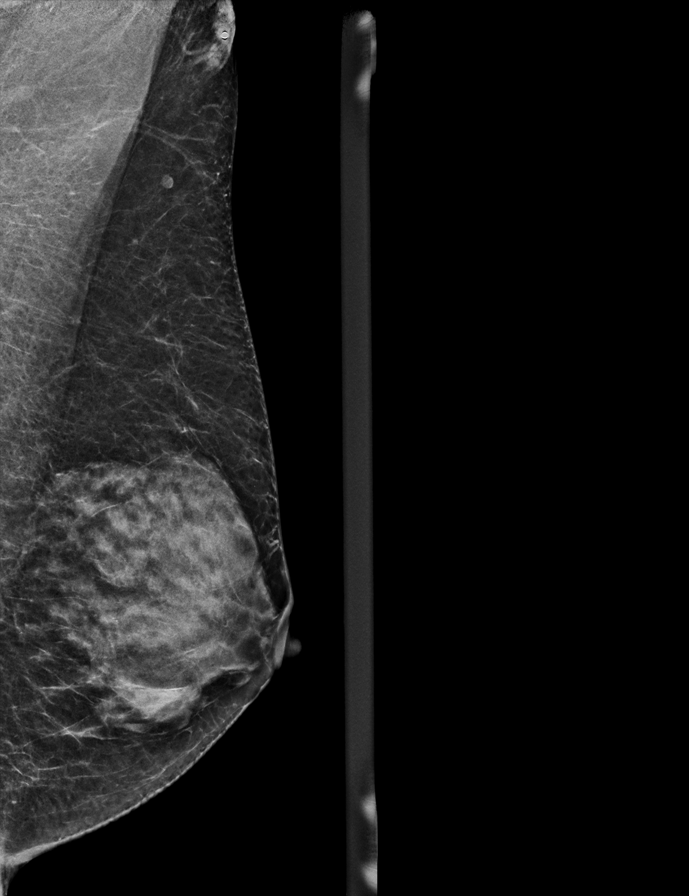

[L CC tomo · 2 of 53 frames shown]
[frame 18/53]
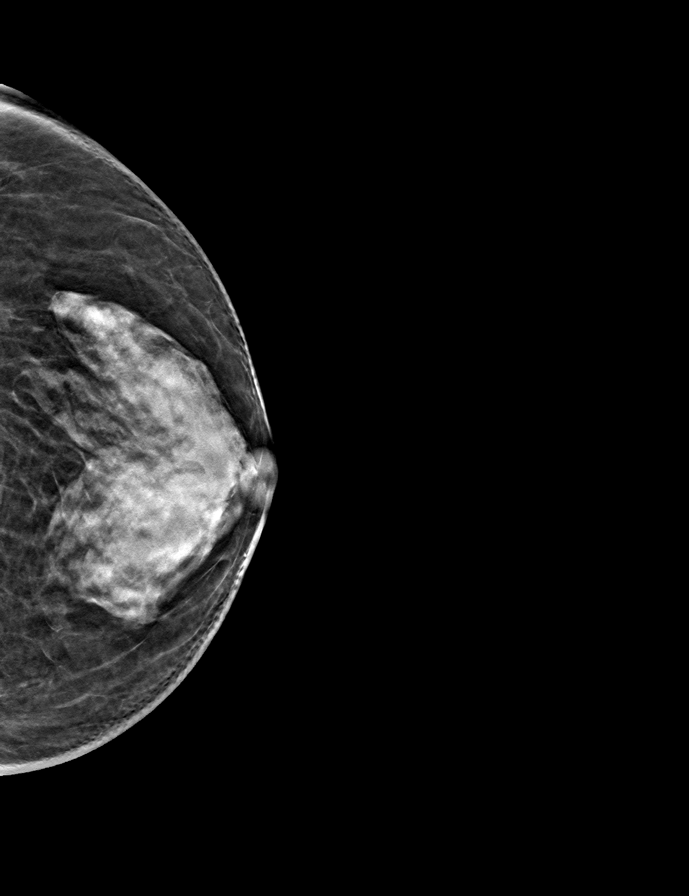
[frame 27/53]
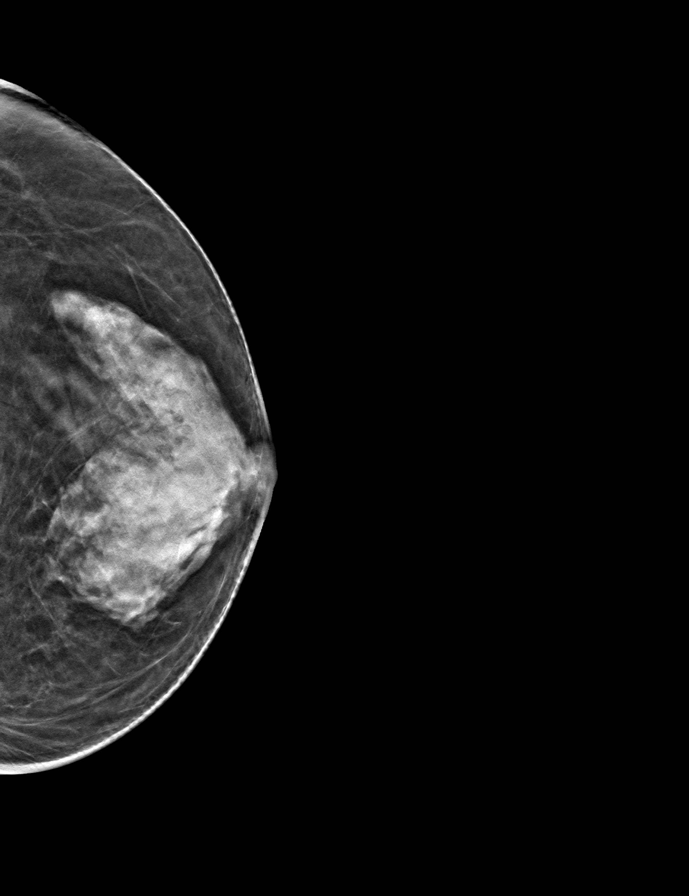

[R MLO tomo · tomo slice 27/53.0]
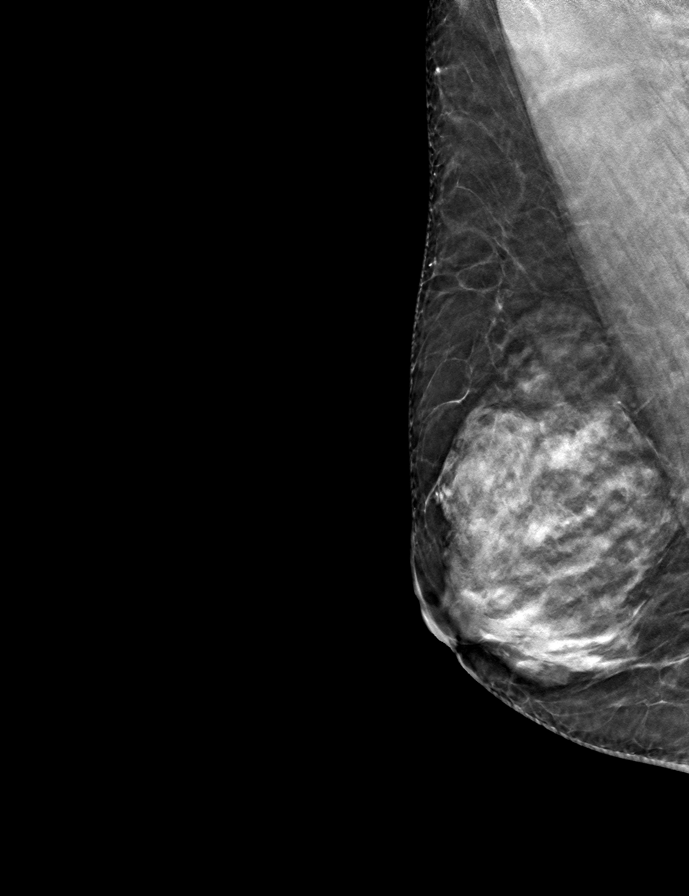

[R CC tomo · tomo slice 27/53.0]
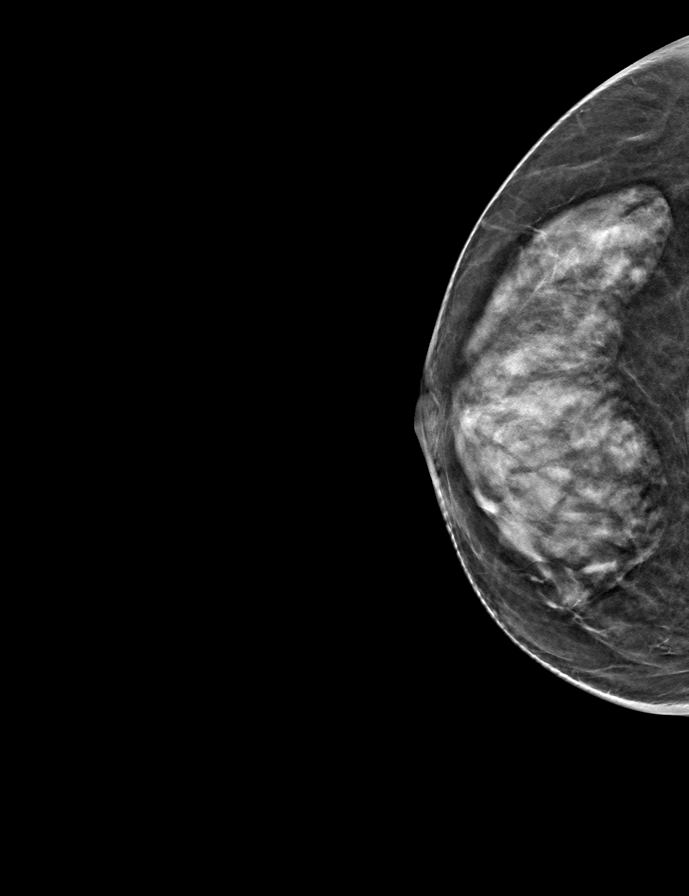

[L MLO tomo · tomo slice 27/52.0]
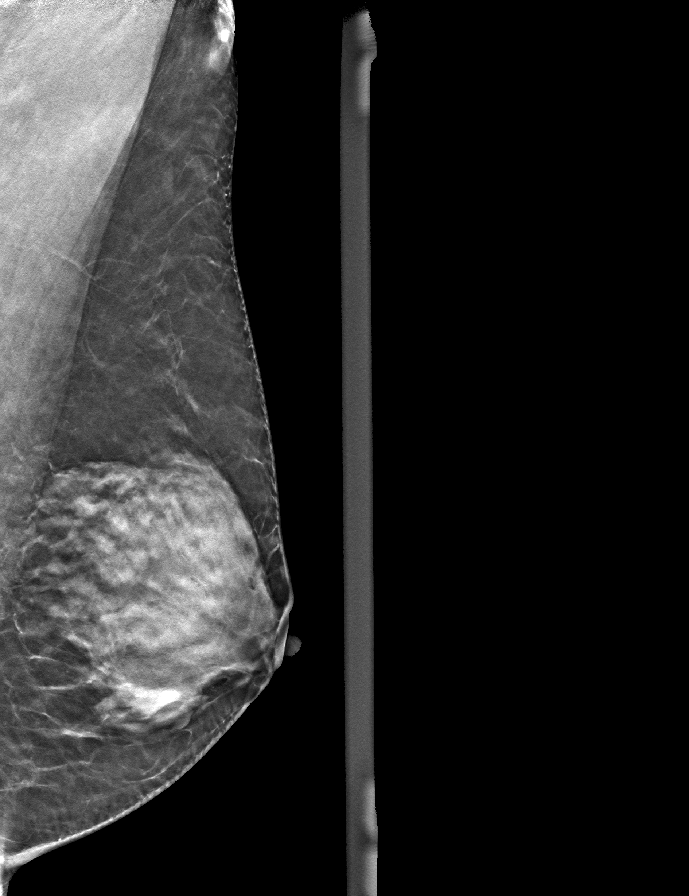

[9 of 24 positions shown; findings below may reference images not displayed]

ACR Breast Density Category c: The breast tissue is heterogeneously
dense, which may obscure small masses.
FINDINGS: There are no findings suspicious for malignancy. Images were
processed with CAD.
IMPRESSION: No mammographic evidence of malignancy. A result letter of this
screening mammogram will be mailed directly to the patient.

RECOMMENDATION:
Screening mammogram in one year. (Code:FT-U-LHB)

BI-RADS CATEGORY  1: Negative.

## 2022-03-23 ENCOUNTER — Ambulatory Visit: Payer: Self-pay | Attending: Nurse Practitioner | Admitting: Nurse Practitioner

## 2022-03-23 ENCOUNTER — Encounter: Payer: Self-pay | Admitting: Nurse Practitioner

## 2022-03-23 VITALS — BP 131/76 | HR 73 | Temp 97.8°F | Ht 60.0 in | Wt 131.8 lb

## 2022-03-23 DIAGNOSIS — E785 Hyperlipidemia, unspecified: Secondary | ICD-10-CM

## 2022-03-23 DIAGNOSIS — G8929 Other chronic pain: Secondary | ICD-10-CM

## 2022-03-23 DIAGNOSIS — M542 Cervicalgia: Secondary | ICD-10-CM

## 2022-03-23 DIAGNOSIS — I10 Essential (primary) hypertension: Secondary | ICD-10-CM

## 2022-03-23 MED ORDER — ATORVASTATIN CALCIUM 20 MG PO TABS: 20.0000 mg | ORAL_TABLET | Freq: Every day | ORAL | 1 refills | Status: DC

## 2022-03-23 MED ORDER — AMLODIPINE BESYLATE 5 MG PO TABS: 5.0000 mg | ORAL_TABLET | Freq: Every day | ORAL | 1 refills | Status: DC

## 2022-03-23 MED ORDER — CYCLOBENZAPRINE HCL 5 MG PO TABS: 5.0000 mg | ORAL_TABLET | Freq: Three times a day (TID) | ORAL | 1 refills | Status: DC | PRN

## 2022-03-23 NOTE — Progress Notes (Signed)
Assessment & Plan:  Lometa was seen today for hypertension.  Diagnoses and all orders for this visit:  Essential hypertension -     amLODipine (NORVASC) 5 MG tablet; Take 1 tablet (5 mg total) by mouth daily. -     Basic metabolic panel Continue all antihypertensives as prescribed.  Reminded to bring in blood pressure log for follow  up appointment.  RECOMMENDATIONS: DASH/Mediterranean Diets are healthier choices for HTN.    Chronic neck pain -     cyclobenzaprine (FLEXERIL) 5 MG tablet; Take 1 tablet (5 mg total) by mouth 3 (three) times daily as needed for muscle spasms.  Dyslipidemia, goal LDL below 100 -     atorvastatin (LIPITOR) 20 MG tablet; Take 1 tablet (20 mg total) by mouth daily.    Patient has been counseled on age-appropriate routine health concerns for screening and prevention. These are reviewed and up-to-date. Referrals have been placed accordingly. Immunizations are up-to-date or declined.    Subjective:   Chief Complaint  Patient presents with   Hypertension   HPI Brenda [name] 56 y.o. female presents to office todayfor follow up to HTN   HTN Blood pressure is well controlled. She does endorse intermittent headaches occurring 1-2 times per week. Headaches are controlled with ibuprofen. I did recommend she purchase a home blood pressure device to check her blood pressures at home however based on today's reading it would not appear her headaches are related to HTN.  BP Readings from Last 3 Encounters:  03/23/22 131/76  09/20/21 139/84  08/23/21 136/78     Neck Pain: Patient complains of posterior neck pain. Event that precipitate these symptoms:  she notes falling in her bathtub 8 months ago however she denies hitting her head or neck . Onset of symptoms several weeks ago, gradually worsening since that time. Current symptoms are  pain and stiffness in the cervical area . Patient denies  any symptoms of radiculopathy . Patient has had no prior  neck problems.  Previous treatments include: OTC pain patch, ibuprofen, stretching exercises.    States since she was diagnosed with COVID last year she has been experiencing a sensation in which she has to constantly clear her throat. She denies dysphagia or globus sensation.     Review of Systems  Constitutional:  Negative for fever, malaise/fatigue and weight loss.  HENT: Negative.  Negative for nosebleeds.   Eyes: Negative.  Negative for blurred vision, double vision and photophobia.  Respiratory: Negative.  Negative for cough and shortness of breath.   Cardiovascular: Negative.  Negative for chest pain, palpitations and leg swelling.  Gastrointestinal: Negative.  Negative for heartburn, nausea and vomiting.  Musculoskeletal:  Positive for myalgias and neck pain.  Neurological: Negative.  Negative for dizziness, focal weakness, seizures and headaches.  Psychiatric/Behavioral: Negative.  Negative for suicidal ideas.     Past Medical History:  Diagnosis Date   Abnormal Pap smear    Carpal tunnel syndrome    Hypercholesterolemia    Hypertension     Past Surgical History:  Procedure Laterality Date   COLONOSCOPY N/A 08/12/2019   Large skin tag right buttock, 2 small hemorrhoid tags, 7 mm rectal polyp sessile, oozing s/p resection requiring epi and cautery, s/p clip placement. Benign colon polyp. Screening in 10 years.    LEEP      Family History  Problem Relation Age of Onset   Hypertension Mother    Hypertension Father    Colon cancer Neg Hx  Social History Reviewed with no changes to be made today.   Outpatient Medications Prior to Visit  Medication Sig Dispense Refill   amLODipine (NORVASC) 5 MG tablet Take 1 tablet by mouth once daily 90 tablet 0   atorvastatin (LIPITOR) 20 MG tablet Take 1 tablet by mouth once daily 90 tablet 1   cetirizine (ZYRTEC ALLERGY) 10 MG tablet Take 1 tablet (10 mg total) by mouth daily. 30 tablet 0   No facility-administered  medications prior to visit.    No Known Allergies     Objective:    BP 131/76   Pulse 73   Temp 97.8 F (36.6 C) (Temporal)   Ht 5' (1.524 m)   Wt 131 lb 12.8 oz (59.8 kg)   LMP 07/28/2016   SpO2 98%   BMI 25.74 kg/m  Wt Readings from Last 3 Encounters:  03/23/22 131 lb 12.8 oz (59.8 kg)  09/20/21 130 lb (59 kg)  11/09/20 132 lb 9.6 oz (60.1 kg)    Physical Exam Vitals and nursing note reviewed.  Constitutional:      Appearance: She is well-developed.  HENT:     Head: Normocephalic and atraumatic.  Cardiovascular:     Rate and Rhythm: Normal rate and regular rhythm.     Heart sounds: Normal heart sounds. No murmur heard.    No friction rub. No gallop.  Pulmonary:     Effort: Pulmonary effort is normal. No tachypnea or respiratory distress.     Breath sounds: Normal breath sounds. No decreased breath sounds, wheezing, rhonchi or rales.  Chest:     Chest wall: No tenderness.  Abdominal:     General: Bowel sounds are normal.     Palpations: Abdomen is soft.  Musculoskeletal:        General: Normal range of motion.     Cervical back: Normal range of motion.  Skin:    General: Skin is warm and dry.  Neurological:     Mental Status: She is alert and oriented to person, place, and time.     Coordination: Coordination normal.  Psychiatric:        Behavior: Behavior normal. Behavior is cooperative.        Thought Content: Thought content normal.        Judgment: Judgment normal.          Patient has been counseled extensively about nutrition and exercise as well as the importance of adherence with medications and regular follow-up. The patient was given clear instructions to go to ER or return to medical center if symptoms don't improve, worsen or new problems develop. The patient verbalized understanding.   Follow-up: Return in about 6 months (around 09/21/2022) for HTN.   Claiborne Rigg, FNP-BC [facility] [place],  [place] [phone]   03/23/2022, 1:56 PM

## 2022-03-24 LAB — BASIC METABOLIC PANEL
BUN/Creatinine Ratio: 20 (ref 9–23)
BUN: 13 mg/dL (ref 6–24)
CO2: 22 mmol/L (ref 20–29)
Calcium: 9.6 mg/dL (ref 8.7–10.2)
Chloride: 103 mmol/L (ref 96–106)
Creatinine, Ser: 0.65 mg/dL (ref 0.57–1.00)
Glucose: 91 mg/dL (ref 70–99)
Potassium: 4.6 mmol/L (ref 3.5–5.2)
Sodium: 141 mmol/L (ref 134–144)
eGFR: 105 mL/min/{1.73_m2} (ref >59)

## 2022-09-21 ENCOUNTER — Ambulatory Visit: Payer: Self-pay | Admitting: Nurse Practitioner

## 2022-11-14 ENCOUNTER — Encounter: Payer: Self-pay | Admitting: Nurse Practitioner

## 2022-11-14 ENCOUNTER — Ambulatory Visit: Payer: Self-pay | Attending: Nurse Practitioner | Admitting: Nurse Practitioner

## 2022-11-14 VITALS — BP 133/85 | HR 66 | Ht 60.0 in | Wt 134.4 lb

## 2022-11-14 DIAGNOSIS — I10 Essential (primary) hypertension: Secondary | ICD-10-CM

## 2022-11-14 DIAGNOSIS — H6121 Impacted cerumen, right ear: Secondary | ICD-10-CM

## 2022-11-14 DIAGNOSIS — G8929 Other chronic pain: Secondary | ICD-10-CM

## 2022-11-14 DIAGNOSIS — H9313 Tinnitus, bilateral: Secondary | ICD-10-CM

## 2022-11-14 DIAGNOSIS — Z1231 Encounter for screening mammogram for malignant neoplasm of breast: Secondary | ICD-10-CM

## 2022-11-14 DIAGNOSIS — M542 Cervicalgia: Secondary | ICD-10-CM

## 2022-11-14 MED ORDER — DEBROX 6.5 % OT SOLN
5.0000 [drp] | Freq: Two times a day (BID) | OTIC | 0 refills | Status: DC
Start: 2022-11-14 — End: 2023-01-17

## 2022-11-14 MED ORDER — DICLOFENAC SODIUM 1 % EX GEL: 2.0000 g | Freq: Four times a day (QID) | CUTANEOUS | 0 refills | Status: AC

## 2022-11-14 NOTE — Progress Notes (Signed)
Assessment & Plan:  Brenda [name] was seen today for hypertension and tinnitus.  Diagnoses and all orders for this visit:  Essential hypertension Well controlled -     CMP14+EGFR Continue all antihypertensives as prescribed.  Reminded to bring in blood pressure log for follow  up appointment.  RECOMMENDATIONS: DASH/Mediterranean Diets are healthier choices for HTN.    Chronic neck pain Patient was urged to apply for the financial assistance program.  They were instructed to inquire at the front desk about the application process for the [employer] discount, orange card or other financial assistance.     -     DG Cervical Spine Complete; Future -     diclofenac Sodium (VOLTAREN) 1 % GEL; Apply 2 g topically 4 (four) times daily.  Impacted cerumen of right ear -     carbamide peroxide (DEBROX) 6.5 % OTIC solution; Place 5 drops into the right ear 2 (two) times daily.  Tinnitus of both ears Currently resolved -     CBC with Differential -     Lipid panel  Breast cancer screening by mammogram -     MS 3D SCR MAMMO BILAT BR (aka MM); Future    Patient has been counseled on age-appropriate routine health concerns for screening and prevention. These are reviewed and up-to-date. Referrals have been placed accordingly. Immunizations are up-to-date or declined.    Subjective:   Chief Complaint  Patient presents with   Hypertension   Tinnitus   Hypertension Associated symptoms include neck pain. Pertinent negatives include no blurred vision, chest pain, headaches, malaise/fatigue, palpitations or shortness of breath.    Brenda [name] 57 y.o. female presents to office today for follow up to HTN and chronic neck pain  Blood pressure is well-controlled with amlodipine 5 mg daily BP Readings from Last 3 Encounters:  11/14/22 133/85  03/23/22 131/76  09/20/21 139/84     Neck Pain: Patient complains of posterior neck pain. Event that precipitate these symptoms:  she notes  falling in her bathtub over 1 year ago however she denies hitting her head or neck . Onset of current symptoms several months ago, gradually worsening since that time. Current symptoms are  pain and stiffness in the cervical area . Patient denies  any symptoms of radiculopathy . Patient has had no prior neck problems.  Previous treatments include: OTC pain patch, ibuprofen, stretching exercises.   Tinnitus: Patient presents with tinnitus. Onset of symptoms was  several weeks ago ago with completely resolved course since that time. Patient describes the tinnitus as fluctuating located in the bilateral ear. The quality is described as variable pitch that sounds like seashells, hissing, and roaring. The pattern is nonpulsatile with an intensity that is medium. Patient describes her level of annoyance as minimally annoying. Associated symptoms include no hearing loss, pain, dizziness, drainage or recurrent otitis.  Patient has had no prior evaluation, treatment or surgery for tinnitus Patient does not have hearing aids at this time. Previous treatments include none.    Review of Systems  Constitutional:  Negative for fever, malaise/fatigue and weight loss.  HENT:  Positive for tinnitus. Negative for ear discharge, ear pain, hearing loss and nosebleeds.   Eyes: Negative.  Negative for blurred vision, double vision and photophobia.  Respiratory: Negative.  Negative for cough and shortness of breath.   Cardiovascular: Negative.  Negative for chest pain, palpitations and leg swelling.  Gastrointestinal: Negative.  Negative for heartburn, nausea and vomiting.  Musculoskeletal:  Positive for neck pain. Negative  for myalgias.  Neurological: Negative.  Negative for dizziness, focal weakness, seizures and headaches.  Psychiatric/Behavioral: Negative.  Negative for suicidal ideas.     Past Medical History:  Diagnosis Date   Abnormal Pap smear    Carpal tunnel syndrome    Hypercholesterolemia    Hypertension      Past Surgical History:  Procedure Laterality Date   COLONOSCOPY N/A 08/12/2019   Large skin tag right buttock, 2 small hemorrhoid tags, 7 mm rectal polyp sessile, oozing s/p resection requiring epi and cautery, s/p clip placement. Benign colon polyp. Screening in 10 years.    LEEP      Family History  Problem Relation Age of Onset   Hypertension Mother    Hypertension Father    Colon cancer Neg Hx     Social History Reviewed with no changes to be made today.   Outpatient Medications Prior to Visit  Medication Sig Dispense Refill   amLODipine (NORVASC) 5 MG tablet Take 1 tablet (5 mg total) by mouth daily. 90 tablet 1   atorvastatin (LIPITOR) 20 MG tablet Take 1 tablet (20 mg total) by mouth daily. 90 tablet 1   cyclobenzaprine (FLEXERIL) 5 MG tablet Take 1 tablet (5 mg total) by mouth 3 (three) times daily as needed for muscle spasms. 30 tablet 1   No facility-administered medications prior to visit.    No Known Allergies     Objective:    BP 133/85 (BP Location: Left Arm, Patient Position: Sitting, Cuff Size: Normal)   Pulse 66   Wt 134 lb 6.4 oz (61 kg)   LMP 07/28/2016   SpO2 99%   BMI 26.25 kg/m  Wt Readings from Last 3 Encounters:  11/14/22 134 lb 6.4 oz (61 kg)  03/23/22 131 lb 12.8 oz (59.8 kg)  09/20/21 130 lb (59 kg)    Physical Exam Vitals and nursing note reviewed.  Constitutional:      Appearance: She is well-developed.  HENT:     Head: Normocephalic and atraumatic.     Right Ear: Hearing normal. There is impacted cerumen.     Left Ear: Hearing, tympanic membrane, ear canal and external ear normal.  Cardiovascular:     Rate and Rhythm: Normal rate and regular rhythm.     Heart sounds: Normal heart sounds. No murmur heard.    No friction rub. No gallop.  Pulmonary:     Effort: Pulmonary effort is normal. No tachypnea or respiratory distress.     Breath sounds: Normal breath sounds. No decreased breath sounds, wheezing, rhonchi or rales.   Chest:     Chest wall: No tenderness.  Abdominal:     General: Bowel sounds are normal.     Palpations: Abdomen is soft.  Musculoskeletal:        General: Normal range of motion.     Cervical back: Normal range of motion.  Skin:    General: Skin is warm and dry.  Neurological:     Mental Status: She is alert and oriented to person, place, and time.     Coordination: Coordination normal.  Psychiatric:        Behavior: Behavior normal. Behavior is cooperative.        Thought Content: Thought content normal.        Judgment: Judgment normal.          Patient has been counseled extensively about nutrition and exercise as well as the importance of adherence with medications and regular follow-up. The patient was given  clear instructions to go to ER or return to medical center if symptoms don't improve, worsen or new problems develop. The patient verbalized understanding.   Follow-up: Return in about 3 months (around 02/14/2023).   Claiborne Rigg, FNP-BC [facility] [place], [place] [phone]   11/14/2022, 3:21 PM

## 2022-11-15 LAB — CMP14+EGFR
ALT: 12 IU/L (ref 0–32)
AST: 13 IU/L (ref 0–40)
Albumin: 4.5 g/dL (ref 3.8–4.9)
Alkaline Phosphatase: 103 IU/L (ref 44–121)
BUN/Creatinine Ratio: 26 — ABNORMAL HIGH (ref 9–23)
BUN: 18 mg/dL (ref 6–24)
Bilirubin Total: <0.2 mg/dL (ref 0.0–1.2)
CO2: 25 mmol/L (ref 20–29)
Calcium: 10 mg/dL (ref 8.7–10.2)
Chloride: 102 mmol/L (ref 96–106)
Creatinine, Ser: 0.68 mg/dL (ref 0.57–1.00)
Globulin, Total: 2.5 g/dL (ref 1.5–4.5)
Glucose: 90 mg/dL (ref 70–99)
Potassium: 4.8 mmol/L (ref 3.5–5.2)
Sodium: 140 mmol/L (ref 134–144)
Total Protein: 7 g/dL (ref 6.0–8.5)
eGFR: 103 mL/min/{1.73_m2} (ref >59)

## 2022-11-15 LAB — CBC WITH DIFFERENTIAL/PLATELET
Basophils Absolute: 0.1 10*3/uL (ref 0.0–0.2)
Basos: 1 %
EOS (ABSOLUTE): 0.1 10*3/uL (ref 0.0–0.4)
Eos: 1 %
Hematocrit: 39.7 % (ref 34.0–46.6)
Hemoglobin: 13.1 g/dL (ref 11.1–15.9)
Immature Grans (Abs): 0 10*3/uL (ref 0.0–0.1)
Immature Granulocytes: 0 %
Lymphocytes Absolute: 2.3 10*3/uL (ref 0.7–3.1)
Lymphs: 29 %
MCH: 30.1 pg (ref 26.6–33.0)
MCHC: 33 g/dL (ref 31.5–35.7)
MCV: 91 fL (ref 79–97)
Monocytes Absolute: 0.6 10*3/uL (ref 0.1–0.9)
Monocytes: 8 %
Neutrophils Absolute: 4.8 10*3/uL (ref 1.4–7.0)
Neutrophils: 61 %
Platelets: 316 10*3/uL (ref 150–450)
RBC: 4.35 x10E6/uL (ref 3.77–5.28)
RDW: 13.1 % (ref 11.7–15.4)
WBC: 7.9 10*3/uL (ref 3.4–10.8)

## 2022-11-15 LAB — LIPID PANEL
Chol/HDL Ratio: 3.3 ratio (ref 0.0–4.4)
Cholesterol, Total: 268 mg/dL — ABNORMAL HIGH (ref 100–199)
HDL: 81 mg/dL (ref >39)
LDL Chol Calc (NIH): 146 mg/dL — ABNORMAL HIGH (ref 0–99)
Triglycerides: 231 mg/dL — ABNORMAL HIGH (ref 0–149)
VLDL Cholesterol Cal: 41 mg/dL — ABNORMAL HIGH (ref 5–40)

## 2022-11-22 ENCOUNTER — Other Ambulatory Visit: Payer: Self-pay | Admitting: Obstetrics and Gynecology

## 2022-11-22 DIAGNOSIS — Z1231 Encounter for screening mammogram for malignant neoplasm of breast: Secondary | ICD-10-CM

## 2023-01-12 ENCOUNTER — Other Ambulatory Visit: Payer: Self-pay | Admitting: Nurse Practitioner

## 2023-01-12 DIAGNOSIS — I10 Essential (primary) hypertension: Secondary | ICD-10-CM

## 2023-01-15 NOTE — Telephone Encounter (Signed)
Requested Prescriptions  Pending Prescriptions Disp Refills   amLODipine (NORVASC) 5 MG tablet [Pharmacy Med Name: amLODIPine Besylate 5 MG Oral Tablet] 90 tablet 0    Sig: Take 1 tablet by mouth once daily     Cardiovascular: Calcium Channel Blockers 2 Passed - 01/12/2023  9:43 PM      Passed - Last BP in normal range    BP Readings from Last 1 Encounters:  11/14/22 133/85         Passed - Last Heart Rate in normal range    Pulse Readings from Last 1 Encounters:  11/14/22 66         Passed - Valid encounter within last 6 months    Recent Outpatient Visits           2 months ago Essential hypertension   [place] [facility] [place], Shea Stakes, NP   9 months ago Essential hypertension   [place] [facility] [place], Shea Stakes, NP   1 year ago Primary hypertension   [place] [facility] [place], Shea Stakes, NP   2 years ago Thumb tendonitis   [facility] [place], [place], [place]   2 years ago Encounter to establish care   [facility] [place], Shea Stakes, NP

## 2023-01-17 ENCOUNTER — Ambulatory Visit: Payer: Self-pay | Admitting: Hematology and Oncology

## 2023-01-17 ENCOUNTER — Ambulatory Visit
Admission: RE | Admit: 2023-01-17 | Discharge: 2023-01-17 | Disposition: A | Discharge status: Home / Self Care | Payer: No Typology Code available for payment source | Source: Ambulatory Visit | Attending: Obstetrics and Gynecology | Admitting: Obstetrics and Gynecology

## 2023-01-17 VITALS — BP 193/92 | Wt 133.0 lb

## 2023-01-17 DIAGNOSIS — Z1231 Encounter for screening mammogram for malignant neoplasm of breast: Secondary | ICD-10-CM

## 2023-01-17 NOTE — Progress Notes (Signed)
Brenda [name] is a 57 y.o. female who presents to [facility] clinic today with no complaints.    Pap Smear: Pap not smear completed today. Last Pap smear was 10/29/2019 and was normal. Per patient has no history of an abnormal Pap smear. Last Pap smear result is available in Epic.   Physical exam: Breasts Breasts symmetrical. No skin abnormalities bilateral breasts. No nipple retraction bilateral breasts. No nipple discharge bilateral breasts. No lymphadenopathy. No lumps palpated bilateral breasts.  MS DIGITAL SCREENING TOMO BILATERAL  Result Date: 11/17/2021 CLINICAL DATA:  Screening. EXAM: DIGITAL SCREENING BILATERAL MAMMOGRAM WITH TOMOSYNTHESIS AND CAD TECHNIQUE: Bilateral screening digital craniocaudal and mediolateral oblique mammograms were obtained. Bilateral screening digital breast tomosynthesis was performed. The images were evaluated with computer-aided detection. COMPARISON:  Previous exam(s). ACR Breast Density Category c: The breast tissue is heterogeneously dense, which may obscure small masses. FINDINGS: There are no findings suspicious for malignancy. IMPRESSION: No mammographic evidence of malignancy. A result letter of this screening mammogram will be mailed directly to the patient. RECOMMENDATION: Screening mammogram in one year. (Code:SM-B-01Y) BI-RADS CATEGORY  1: Negative. Electronically Signed   By: Ted Mcalpine M.D.   On: 11/17/2021 10:40   MS DIGITAL SCREENING TOMO BILATERAL  Result Date: 12/04/2019 CLINICAL DATA:  Screening. EXAM: DIGITAL SCREENING BILATERAL MAMMOGRAM WITH TOMO AND CAD COMPARISON:  Previous exam(s). ACR Breast Density Category c: The breast tissue is heterogeneously dense, which may obscure small masses. FINDINGS: There are no findings suspicious for malignancy. Images were processed with CAD. IMPRESSION: No mammographic evidence of malignancy. A result letter of this screening mammogram will be mailed directly to the patient. RECOMMENDATION:  Screening mammogram in one year. (Code:SM-B-01Y) BI-RADS CATEGORY  1: Negative. Electronically Signed   By: Baird Lyons M.D.   On: 12/04/2019 14:32         Pelvic/Bimanual Pap is not indicated today    Smoking History: Patient has never smoked and was not referred to quit line.    Patient Navigation: Patient education provided. Access to services provided for patient through [facility] program. No interpreter provided. No transportation provided   Colorectal Cancer Screening: Per patient had colonoscopy 2021. No complaints today. Will follow up in 10 years.   Breast and Cervical Cancer Risk Assessment: Patient does not have family history of breast cancer, known genetic mutations, or radiation treatment to the chest before age 72. Patient does not have history of cervical dysplasia, immunocompromised, or DES exposure in-utero.  Risk Scores as of Encounter on 01/17/2023     Brenda [name]           5-year 1.44%   Lifetime 9.93%   This patient is [place] but has no documented birth country, so the [place] model used data from [place] patients to calculate their risk score. Document a birth country in the Demographics activity for a more accurate score.         Last calculated by Caprice Red, CMA on 01/17/2023 at  9:57 AM        A: [facility] exam without pap smear No complaints with benign exam.   P: Referred patient to the [facility] for a screening mammogram. Appointment scheduled 01/17/23.  Brenda [name] A, NP 01/17/2023 10:19 AM

## 2023-01-17 NOTE — Patient Instructions (Signed)
Taught [name] about self breast awareness and gave educational materials to take home. Patient did not need a Pap smear today due to last Pap smear was in 10/29/2019 per patient.  Let her know [facility] will cover Pap smears every 5 years unless has a history of abnormal Pap smears. Referred patient to the [facility] for screening mammogram. Appointment scheduled for 01/17/2023. Patient aware of appointment and will be there. Let patient know will follow up with her within the next couple weeks with results. [name] verbalized understanding.  Pascal Lux, NP 10:22 AM

## 2023-04-11 ENCOUNTER — Other Ambulatory Visit: Payer: Self-pay | Admitting: Nurse Practitioner

## 2023-04-11 DIAGNOSIS — I10 Essential (primary) hypertension: Secondary | ICD-10-CM

## 2023-05-21 ENCOUNTER — Ambulatory Visit: Payer: Self-pay | Attending: Nurse Practitioner | Admitting: Nurse Practitioner

## 2023-05-21 ENCOUNTER — Encounter: Payer: Self-pay | Admitting: Nurse Practitioner

## 2023-05-21 VITALS — BP 133/77 | HR 66 | Ht 60.0 in | Wt 135.0 lb

## 2023-05-21 DIAGNOSIS — R002 Palpitations: Secondary | ICD-10-CM

## 2023-05-21 DIAGNOSIS — I10 Essential (primary) hypertension: Secondary | ICD-10-CM

## 2023-05-21 MED ORDER — AMLODIPINE BESYLATE 5 MG PO TABS: 5.0000 mg | ORAL_TABLET | Freq: Every day | ORAL | 1 refills | Status: DC

## 2023-05-21 NOTE — Progress Notes (Addendum)
 Assessment & Plan:  Brenda [name] was seen today for medical management of chronic issues and palpitations.  Diagnoses and all orders for this visit:  Essential hypertension -     amLODipine  (NORVASC ) 5 MG tablet; Take 1 tablet (5 mg total) by mouth daily. -     CMP14+EGFR Continue amlodipine  as prescribed.  Reminded to bring in blood pressure log for follow  up appointment.  RECOMMENDATIONS: DASH/Mediterranean Diets are healthier choices for HTN.    Intermittent palpitations EKG NORMAL -     Thyroid  Panel With TSH -     CMP14+EGFR Avoid caffeine products   Patient has been counseled on age-appropriate routine health concerns for screening and prevention. These are reviewed and up-to-date. Referrals have been placed accordingly. Immunizations are up-to-date or declined.    Subjective:   Chief Complaint  Patient presents with   Medical Management of Chronic Issues   Palpitations    Brenda [name] 58 y.o. female presents to office today for follow up to HTN. Also with concerns of heart palpitations today.     She notes recent onset of intermittent palpitations. Initially started the Week of Christmas and were occurring daily. She notes duration of several seconds and palpitations resolved on their own. She the most recent episode was today while she was in the office lobby waiting to be seen. Denies chest pain or shortness of breath. She does not consume caffeine daily.   Blood pressure is well controlled. She is currently prescribed amlodipine  5 mg daily.  BP Readings from Last 3 Encounters:  05/21/23 133/77  01/17/23 (!) 193/92  11/14/22 133/85     Review of Systems  Constitutional:  Negative for fever, malaise/fatigue and weight loss.  HENT: Negative.  Negative for nosebleeds.   Eyes: Negative.  Negative for blurred vision, double vision and photophobia.  Respiratory: Negative.  Negative for cough and shortness of breath.   Cardiovascular: Negative.  Negative for  chest pain, palpitations and leg swelling.  Gastrointestinal: Negative.  Negative for heartburn, nausea and vomiting.  Musculoskeletal: Negative.  Negative for myalgias.  Neurological: Negative.  Negative for dizziness, focal weakness, seizures and headaches.  Psychiatric/Behavioral: Negative.  Negative for suicidal ideas.     Past Medical History:  Diagnosis Date   Abnormal Pap smear    Carpal tunnel syndrome    Hypercholesterolemia    Hypertension     Past Surgical History:  Procedure Laterality Date   COLONOSCOPY N/A 08/12/2019   Large skin tag right buttock, 2 small hemorrhoid tags, 7 mm rectal polyp sessile, oozing s/p resection requiring epi and cautery, s/p clip placement. Benign colon polyp. Screening in 10 years.    LEEP      Family History  Problem Relation Age of Onset   Hypertension Mother    Hypertension Father    Colon cancer Neg Hx    Breast cancer Neg Hx     Social History Reviewed with no changes to be made today.   Outpatient Medications Prior to Visit  Medication Sig Dispense Refill   atorvastatin  (LIPITOR) 20 MG tablet Take 1 tablet (20 mg total) by mouth daily. 90 tablet 1   cyclobenzaprine  (FLEXERIL ) 5 MG tablet Take 1 tablet (5 mg total) by mouth 3 (three) times daily as needed for muscle spasms. 30 tablet 1   MAGNESIUM PO Take by mouth.     amLODipine  (NORVASC ) 5 MG tablet Take 1 tablet by mouth once daily 90 tablet 0   No facility-administered medications prior to visit.  No Known Allergies     Objective:    BP 133/77 (BP Location: Left Arm, Patient Position: Sitting, Cuff Size: Normal)   Pulse 66   Ht 5' (1.524 m)   Wt 135 lb (61.2 kg)   LMP 07/28/2016   SpO2 99%   BMI 26.37 kg/m  Wt Readings from Last 3 Encounters:  05/21/23 135 lb (61.2 kg)  01/17/23 133 lb (60.3 kg)  11/14/22 134 lb 6.4 oz (61 kg)    Physical Exam Vitals and nursing note reviewed.  Constitutional:      Appearance: She is well-developed.  HENT:     Head:  Normocephalic and atraumatic.  Cardiovascular:     Rate and Rhythm: Normal rate and regular rhythm.     Heart sounds: Normal heart sounds. No murmur heard.    No friction rub. No gallop.  Pulmonary:     Effort: Pulmonary effort is normal. No tachypnea or respiratory distress.     Breath sounds: Normal breath sounds. No decreased breath sounds, wheezing, rhonchi or rales.  Chest:     Chest wall: No tenderness.  Abdominal:     General: Bowel sounds are normal.     Palpations: Abdomen is soft.  Musculoskeletal:        General: Normal range of motion.     Cervical back: Normal range of motion.  Skin:    General: Skin is warm and dry.  Neurological:     Mental Status: She is alert and oriented to person, place, and time.     Coordination: Coordination normal.  Psychiatric:        Behavior: Behavior normal. Behavior is cooperative.        Thought Content: Thought content normal.        Judgment: Judgment normal.          Patient has been counseled extensively about nutrition and exercise as well as the importance of adherence with medications and regular follow-up. The patient was given clear instructions to go to ER or return to medical center if symptoms don't improve, worsen or new problems develop. The patient verbalized understanding.   Follow-up: Return in about 3 months (around 08/19/2023).   [name], FNP-BC [facility] [place], [place] [phone]   05/21/2023, 3:31 PM

## 2023-05-21 NOTE — Progress Notes (Signed)
 Patient has been experiencing palpitations for two weeks.

## 2023-05-22 LAB — CMP14+EGFR
ALT: 12 [IU]/L (ref 0–32)
AST: 17 [IU]/L (ref 0–40)
Albumin: 4.8 g/dL (ref 3.8–4.9)
Alkaline Phosphatase: 127 [IU]/L — ABNORMAL HIGH (ref 44–121)
BUN/Creatinine Ratio: 23 (ref 9–23)
BUN: 16 mg/dL (ref 6–24)
Bilirubin Total: 0.5 mg/dL (ref 0.0–1.2)
CO2: 24 mmol/L (ref 20–29)
Calcium: 10.1 mg/dL (ref 8.7–10.2)
Chloride: 103 mmol/L (ref 96–106)
Creatinine, Ser: 0.7 mg/dL (ref 0.57–1.00)
Globulin, Total: 2.6 g/dL (ref 1.5–4.5)
Glucose: 97 mg/dL (ref 70–99)
Potassium: 5.1 mmol/L (ref 3.5–5.2)
Sodium: 142 mmol/L (ref 134–144)
Total Protein: 7.4 g/dL (ref 6.0–8.5)
eGFR: 102 mL/min/{1.73_m2} (ref >59)

## 2023-05-22 LAB — THYROID PANEL WITH TSH
Free Thyroxine Index: 2.3 (ref 1.2–4.9)
T3 Uptake Ratio: 25 % (ref 24–39)
T4, Total: 9.3 ug/dL (ref 4.5–12.0)
TSH: 1.1 u[IU]/mL (ref 0.450–4.500)

## 2023-06-04 ENCOUNTER — Encounter: Payer: Self-pay | Admitting: Nurse Practitioner

## 2023-06-05 ENCOUNTER — Other Ambulatory Visit: Payer: Self-pay | Admitting: Nurse Practitioner

## 2023-06-05 DIAGNOSIS — E785 Hyperlipidemia, unspecified: Secondary | ICD-10-CM

## 2024-03-31 ENCOUNTER — Other Ambulatory Visit: Payer: Self-pay | Admitting: Nurse Practitioner

## 2024-03-31 DIAGNOSIS — I10 Essential (primary) hypertension: Secondary | ICD-10-CM

## 2024-05-03 ENCOUNTER — Other Ambulatory Visit: Payer: Self-pay | Admitting: Family Medicine

## 2024-05-03 DIAGNOSIS — E785 Hyperlipidemia, unspecified: Secondary | ICD-10-CM

## 2024-05-19 ENCOUNTER — Encounter: Payer: Self-pay | Admitting: Nurse Practitioner

## 2024-05-19 ENCOUNTER — Ambulatory Visit: Payer: Self-pay | Attending: Nurse Practitioner | Admitting: Nurse Practitioner

## 2024-05-19 VITALS — BP 126/76 | HR 64 | Ht 60.0 in | Wt 136.0 lb

## 2024-05-19 DIAGNOSIS — E785 Hyperlipidemia, unspecified: Secondary | ICD-10-CM

## 2024-05-19 DIAGNOSIS — G8929 Other chronic pain: Secondary | ICD-10-CM

## 2024-05-19 DIAGNOSIS — Z1231 Encounter for screening mammogram for malignant neoplasm of breast: Secondary | ICD-10-CM

## 2024-05-19 DIAGNOSIS — E7800 Pure hypercholesterolemia, unspecified: Secondary | ICD-10-CM

## 2024-05-19 DIAGNOSIS — I10 Essential (primary) hypertension: Secondary | ICD-10-CM

## 2024-05-19 DIAGNOSIS — M542 Cervicalgia: Secondary | ICD-10-CM

## 2024-05-19 MED ORDER — ATORVASTATIN CALCIUM 20 MG PO TABS: 20.0000 mg | ORAL_TABLET | Freq: Every day | ORAL | 3 refills | Status: AC

## 2024-05-19 MED ORDER — AMLODIPINE BESYLATE 5 MG PO TABS: 5.0000 mg | ORAL_TABLET | Freq: Every day | ORAL | 1 refills | Status: AC

## 2024-05-19 MED ORDER — CYCLOBENZAPRINE HCL 5 MG PO TABS: 5.0000 mg | ORAL_TABLET | Freq: Three times a day (TID) | ORAL | 1 refills | Status: AC | PRN

## 2024-05-19 NOTE — Progress Notes (Signed)
 "  Assessment & Plan:  Brenda [name] was seen today for hypertension.  Diagnoses and all orders for this visit:  Primary hypertension -     amLODipine  (NORVASC ) 5 MG tablet; Take 1 tablet (5 mg total) by mouth daily. -     CMP14+EGFR Continue all antihypertensives as prescribed.  Reminded to bring in blood pressure log for follow  up appointment.  RECOMMENDATIONS: DASH/Mediterranean Diets are healthier choices for HTN.    Dyslipidemia, goal LDL below 100 -     atorvastatin  (LIPITOR) 20 MG tablet; Take 1 tablet (20 mg total) by mouth daily. INSTRUCTIONS: Work on a low fat, heart healthy diet and participate in regular aerobic exercise program by working out at least 150 minutes per week; 5 days a week-30 minutes per day. Avoid red meat/beef/steak,  fried foods. junk foods, sodas, sugary drinks, unhealthy snacking, alcohol and smoking.  Drink at least 80 oz of water  per day and monitor your carbohydrate intake daily.   -     Lipid panel  Chronic neck pain Well controlled  Takes flexeril  sparingly -     cyclobenzaprine  (FLEXERIL ) 5 MG tablet; Take 1 tablet (5 mg total) by mouth 3 (three) times daily as needed for muscle spasms.  Breast cancer screening by mammogram -     MS 3D SCR MAMMO BILAT BR (aka MM); Future      Patient has been counseled on age-appropriate routine health concerns for screening and prevention. These are reviewed and up-to-date. Referrals have been placed accordingly. Immunizations are up-to-date or declined.    Subjective:   Chief Complaint  Patient presents with   Hypertension    Brenda [name] 59 y.o. female presents to office today for follow up to HTN  She is overdue for mammogram. Referral placed Pap smear will be scheduled for June 2026  HTN Blood pressure is well controlled with amlodipine  5 mg daily BP Readings from Last 3 Encounters:  05/19/24 126/76  05/21/23 133/77  01/17/23 (!) 193/92    Taking atorvastatin  20 mg daily as prescribed.   The 10-year ASCVD risk score (Arnett DK, et al., 2019) is: 2.7%   Values used to calculate the score:     Age: 38 years     Clinically relevant sex: Female     Is Non-Hispanic African American: No     Diabetic: No     Tobacco smoker: No     Systolic Blood Pressure: 126 mmHg     Is BP treated: Yes     HDL Cholesterol: 81 mg/dL     Total Cholesterol: 268 mg/dL  Review of Systems  Constitutional:  Negative for fever, malaise/fatigue and weight loss.  HENT: Negative.  Negative for nosebleeds.   Eyes: Negative.  Negative for blurred vision, double vision and photophobia.  Respiratory: Negative.  Negative for cough and shortness of breath.   Cardiovascular: Negative.  Negative for chest pain, palpitations and leg swelling.  Gastrointestinal: Negative.  Negative for heartburn, nausea and vomiting.  Musculoskeletal: Negative.  Negative for myalgias.  Neurological: Negative.  Negative for dizziness, focal weakness, seizures and headaches.  Psychiatric/Behavioral: Negative.  Negative for suicidal ideas.     Past Medical History:  Diagnosis Date   Abnormal Pap smear    Carpal tunnel syndrome    Hypercholesterolemia    Hypertension     Past Surgical History:  Procedure Laterality Date   COLONOSCOPY N/A 08/12/2019   Large skin tag right buttock, 2 small hemorrhoid tags, 7 mm rectal polyp sessile, oozing  s/p resection requiring epi and cautery, s/p clip placement. Benign colon polyp. Screening in 10 years.    LEEP      Family History  Problem Relation Age of Onset   Hypertension Mother    Hypertension Father    Colon cancer Neg Hx    Breast cancer Neg Hx     Social History Reviewed with no changes to be made today.   Outpatient Medications Prior to Visit  Medication Sig Dispense Refill   MAGNESIUM PO Take by mouth.     amLODipine  (NORVASC ) 5 MG tablet Take 1 tablet by mouth once daily 90 tablet 0   atorvastatin  (LIPITOR) 20 MG tablet Take 1 tablet by mouth once daily (Patient  not taking: Reported on 05/19/2024) 90 tablet 2   cyclobenzaprine  (FLEXERIL ) 5 MG tablet Take 1 tablet (5 mg total) by mouth 3 (three) times daily as needed for muscle spasms. (Patient not taking: Reported on 05/19/2024) 30 tablet 1   No facility-administered medications prior to visit.    Allergies[1]     Objective:    BP 126/76 (BP Location: Left Arm, Patient Position: Sitting, Cuff Size: Normal)   Pulse 64   Ht 5' (1.524 m)   Wt 136 lb (61.7 kg)   LMP 07/28/2016   SpO2 98%   BMI 26.56 kg/m  Wt Readings from Last 3 Encounters:  05/19/24 136 lb (61.7 kg)  05/21/23 135 lb (61.2 kg)  01/17/23 133 lb (60.3 kg)    Physical Exam Vitals and nursing note reviewed.  Constitutional:      Appearance: She is well-developed.  HENT:     Head: Normocephalic and atraumatic.  Cardiovascular:     Rate and Rhythm: Normal rate and regular rhythm.     Heart sounds: Normal heart sounds. No murmur heard.    No friction rub. No gallop.  Pulmonary:     Effort: Pulmonary effort is normal. No tachypnea or respiratory distress.     Breath sounds: Normal breath sounds. No decreased breath sounds, wheezing, rhonchi or rales.  Chest:     Chest wall: No tenderness.  Musculoskeletal:        General: Normal range of motion.     Cervical back: Normal range of motion.  Skin:    General: Skin is warm and dry.  Neurological:     Mental Status: She is alert and oriented to person, place, and time.     Coordination: Coordination normal.  Psychiatric:        Behavior: Behavior normal. Behavior is cooperative.        Thought Content: Thought content normal.        Judgment: Judgment normal.          Patient has been counseled extensively about nutrition and exercise as well as the importance of adherence with medications and regular follow-up. The patient was given clear instructions to go to ER or return to medical center if symptoms don't improve, worsen or new problems develop. The patient  verbalized understanding.   Follow-up: Return in about 6 months (around 11/16/2024) for PAP SMEAR.   Brenda LELON Servant, FNP-BC [facility] [place], [place] [phone]   05/19/2024, 10:41 AM     [1] No Known Allergies  "

## 2024-05-19 NOTE — Patient Instructions (Signed)
[facility] has been trying to call you to schedule a mammogram. You can call them at any of these numbers to schedule.  [phone]

## 2024-05-20 ENCOUNTER — Ambulatory Visit: Payer: Self-pay | Admitting: Nurse Practitioner

## 2024-05-20 LAB — CMP14+EGFR
ALT: 9 IU/L (ref 0–32)
AST: 14 IU/L (ref 0–40)
Albumin: 4.5 g/dL (ref 3.8–4.9)
Alkaline Phosphatase: 94 IU/L (ref 49–135)
BUN/Creatinine Ratio: 28 — ABNORMAL HIGH (ref 9–23)
BUN: 15 mg/dL (ref 6–24)
Bilirubin Total: 0.4 mg/dL (ref 0.0–1.2)
CO2: 23 mmol/L (ref 20–29)
Calcium: 9.7 mg/dL (ref 8.7–10.2)
Chloride: 105 mmol/L (ref 96–106)
Creatinine, Ser: 0.54 mg/dL — ABNORMAL LOW (ref 0.57–1.00)
Globulin, Total: 2.1 g/dL (ref 1.5–4.5)
Glucose: 98 mg/dL (ref 70–99)
Potassium: 4.5 mmol/L (ref 3.5–5.2)
Sodium: 140 mmol/L (ref 134–144)
Total Protein: 6.6 g/dL (ref 6.0–8.5)
eGFR: 108 mL/min/1.73

## 2024-05-20 LAB — LIPID PANEL
Chol/HDL Ratio: 3.2 ratio (ref 0.0–4.4)
Cholesterol, Total: 293 mg/dL — ABNORMAL HIGH (ref 100–199)
HDL: 92 mg/dL
LDL Chol Calc (NIH): 186 mg/dL — ABNORMAL HIGH (ref 0–99)
Triglycerides: 91 mg/dL (ref 0–149)
VLDL Cholesterol Cal: 15 mg/dL (ref 5–40)

## 2024-11-16 ENCOUNTER — Ambulatory Visit: Payer: Self-pay | Admitting: Nurse Practitioner
